# Patient Record
Sex: Male | Born: 1960 | Race: White | Hispanic: No | Smoking: Never smoker
Health system: Southern US, Community
[De-identification: ages and names within clinical notes are randomized; demographics above are authoritative.]

## PROBLEM LIST (undated history)

## (undated) DIAGNOSIS — E119 Type 2 diabetes mellitus without complications: Secondary | ICD-10-CM

## (undated) DIAGNOSIS — I10 Essential (primary) hypertension: Secondary | ICD-10-CM

## (undated) DIAGNOSIS — M1990 Unspecified osteoarthritis, unspecified site: Secondary | ICD-10-CM

## (undated) DIAGNOSIS — R51 Headache: Secondary | ICD-10-CM

## (undated) HISTORY — PX: HERNIA REPAIR: SHX51

---

## 2014-04-08 ENCOUNTER — Other Ambulatory Visit (HOSPITAL_COMMUNITY): Payer: Self-pay | Admitting: Orthopaedic Surgery

## 2014-04-11 ENCOUNTER — Encounter (HOSPITAL_COMMUNITY): Payer: Self-pay | Admitting: Pharmacy Technician

## 2014-04-15 NOTE — Patient Instructions (Addendum)
20     Your procedure is scheduled on:  Friday 04/26/2014  Report to [facility] Main Entrance and follow signs to Short Stay  At 0745  AM.  Call this number if you have problems the night before or morning of surgery: [phone]   Remember:             IF YOU USE CPAP,BRING MASK AND TUBING AM OF SURGERY!             IF YOU DO NOT HAVE YOUR TYPE AND SCREEN DRAWN AT PRE-ADMIT APPOINTMENT, YOU WILL HAVE IT DRAWN AM OF SURGERY!   Do not eat food or drink liquids AFTER MIDNIGHT!  Take these medicines the morning of surgery with A SIP OF WATER: NONE    [facility] IS NOT RESPONSIBLE FOR ANY BELONGINGS OR VALUABLES BROUGHT TO HOSPITAL.  Marland Kitchen  Leave suitcase in the car. After surgery it may be brought to your room.  For patients admitted to the hospital, checkout time is 11:00 AM the day of              Discharge.    DO NOT WEAR JEWELRY,MAKE-UP,LOTIONS,POWDERS,PERFUMES,CONTACTS , DENTURES OR BRIDGEWORK ,AND DO NOT WEAR FALSE EYELASHES                                    Patients discharged the day of surgery will not be allowed to drive home. If going home the same day of surgery, must have someone stay with you first 24 hrs.at home and arrange for someone to drive you home from the Hospital.                         YOUR DRIVER IS: N/A   Special Instructions:              Please read over the following fact sheets that you were given:             1. [place] PREPARING FOR SURGERY SHEET              2.INCENTIVE SPIROMETRY                                        [place].Cathlean Sauer     [phone]                              [facility] - Preparing for Surgery Before surgery, you can play an important role.  Because skin is not sterile, your skin needs to be as free of germs as possible.  You can reduce the number of germs on your skin by washing with CHG (chlorahexidine gluconate) soap before surgery.  CHG is an antiseptic cleaner which kills germs and bonds with the skin to  continue killing germs even after washing. Please DO NOT use if you have an allergy to CHG or antibacterial soaps.  If your skin becomes reddened/irritated stop using the CHG and inform your nurse when you arrive at Short Stay. Do not shave (including legs and underarms) for at least 48 hours prior to the first CHG shower.  You may shave your face/neck. Please follow these instructions carefully:  1.  Shower with CHG Soap the night before surgery and  the  morning of Surgery.  2.  If you choose to wash your hair, wash your hair first as usual with your  normal  shampoo.  3.  After you shampoo, rinse your hair and body thoroughly to remove the  shampoo.                           4.  Use CHG as you would any other liquid soap.  You can apply chg directly  to the skin and wash                       Gently with a scrungie or clean washcloth.  5.  Apply the CHG Soap to your body ONLY FROM THE NECK DOWN.   Do not use on face/ open                           Wound or open sores. Avoid contact with eyes, ears mouth and genitals (private parts).                       Wash face,  Genitals (private parts) with your normal soap.             6.  Wash thoroughly, paying special attention to the area where your surgery  will be performed.  7.  Thoroughly rinse your body with warm water from the neck down.  8.  DO NOT shower/wash with your normal soap after using and rinsing off  the CHG Soap.                9.  Pat yourself dry with a clean towel.            10.  Wear clean pajamas.            11.  Place clean sheets on your bed the night of your first shower and do not  sleep with pets. Day of Surgery : Do not apply any lotions/deodorants the morning of surgery.  Please wear clean clothes to the hospital/surgery center.  FAILURE TO FOLLOW THESE INSTRUCTIONS MAY RESULT IN THE CANCELLATION OF YOUR SURGERY PATIENT SIGNATURE_________________________________  NURSE  SIGNATURE__________________________________  ________________________________________________________________________   Rogelia MireIncentive Spirometer  An incentive spirometer is a tool that can help keep your lungs clear and active. This tool measures how well you are filling your lungs with each breath. Taking long deep breaths may help reverse or decrease the chance of developing breathing (pulmonary) problems (especially infection) following:  A long period of time when you are unable to move or be active. BEFORE THE PROCEDURE   If the spirometer includes an indicator to show your best effort, your nurse or respiratory therapist will set it to a desired goal.  If possible, sit up straight or lean slightly forward. Try not to slouch.  Hold the incentive spirometer in an upright position. INSTRUCTIONS FOR USE  1. Sit on the edge of your bed if possible, or sit up as far as you can in bed or on a chair. 2. Hold the incentive spirometer in an upright position. 3. Breathe out normally. 4. Place the mouthpiece in your mouth and seal your lips tightly around it. 5. Breathe in slowly and as deeply as possible, raising the piston or the ball toward the top of the column. 6. Hold your breath for 3-5 seconds or for as long  as possible. Allow the piston or ball to fall to the bottom of the column. 7. Remove the mouthpiece from your mouth and breathe out normally. 8. Rest for a few seconds and repeat Steps 1 through 7 at least 10 times every 1-2 hours when you are awake. Take your time and take a few normal breaths between deep breaths. 9. The spirometer may include an indicator to show your best effort. Use the indicator as a goal to work toward during each repetition. 10. After each set of 10 deep breaths, practice coughing to be sure your lungs are clear. If you have an incision (the cut made at the time of surgery), support your incision when coughing by placing a pillow or rolled up towels firmly  against it. Once you are able to get out of bed, walk around indoors and cough well. You may stop using the incentive spirometer when instructed by your caregiver.  RISKS AND COMPLICATIONS  Take your time so you do not get dizzy or light-headed.  If you are in pain, you may need to take or ask for pain medication before doing incentive spirometry. It is harder to take a deep breath if you are having pain. AFTER USE  Rest and breathe slowly and easily.  It can be helpful to keep track of a log of your progress. Your caregiver can provide you with a simple table to help with this. If you are using the spirometer at home, follow these instructions: SEEK MEDICAL CARE IF:   You are having difficultly using the spirometer.  You have trouble using the spirometer as often as instructed.  Your pain medication is not giving enough relief while using the spirometer.  You develop fever of 100.5 F (38.1 C) or higher. SEEK IMMEDIATE MEDICAL CARE IF:   You cough up bloody sputum that had not been present before.  You develop fever of 102 F (38.9 C) or greater.  You develop worsening pain at or near the incision site. MAKE SURE YOU:   Understand these instructions.  Will watch your condition.  Will get help right away if you are not doing well or get worse. Document Released: 02/07/2007 Document Revised: 12/20/2011 Document Reviewed: 04/10/2007 ExitCare Patient Information 2014 [employer], [place].   ________________________________________________________________________  WHAT IS A BLOOD TRANSFUSION? Blood Transfusion Information  A transfusion is the replacement of blood or some of its parts. Blood is made up of multiple cells which provide different functions.  Red blood cells carry oxygen and are used for blood loss replacement.  White blood cells fight against infection.  Platelets control bleeding.  Plasma helps clot blood.  Other blood products are available for  specialized needs, such as hemophilia or other clotting disorders. BEFORE THE TRANSFUSION  Who gives blood for transfusions?   Healthy volunteers who are fully evaluated to make sure their blood is safe. This is blood bank blood. Transfusion therapy is the safest it has ever been in the practice of medicine. Before blood is taken from a donor, a complete history is taken to make sure that person has no history of diseases nor engages in risky social behavior (examples are intravenous drug use or sexual activity with multiple partners). The donor's travel history is screened to minimize risk of transmitting infections, such as malaria. The donated blood is tested for signs of infectious diseases, such as HIV and hepatitis. The blood is then tested to be sure it is compatible with you in order to minimize the chance of  a transfusion reaction. If you or a relative donates blood, this is often done in anticipation of surgery and is not appropriate for emergency situations. It takes many days to process the donated blood. RISKS AND COMPLICATIONS Although transfusion therapy is very safe and saves many lives, the main dangers of transfusion include:   Getting an infectious disease.  Developing a transfusion reaction. This is an allergic reaction to something in the blood you were given. Every precaution is taken to prevent this. The decision to have a blood transfusion has been considered carefully by your caregiver before blood is given. Blood is not given unless the benefits outweigh the risks. AFTER THE TRANSFUSION  Right after receiving a blood transfusion, you will usually feel much better and more energetic. This is especially true if your red blood cells have gotten low (anemic). The transfusion raises the level of the red blood cells which carry oxygen, and this usually causes an energy increase.  The nurse administering the transfusion will monitor you carefully for complications. HOME CARE  INSTRUCTIONS  No special instructions are needed after a transfusion. You may find your energy is better. Speak with your caregiver about any limitations on activity for underlying diseases you may have. SEEK MEDICAL CARE IF:   Your condition is not improving after your transfusion.  You develop redness or irritation at the intravenous (IV) site. SEEK IMMEDIATE MEDICAL CARE IF:  Any of the following symptoms occur over the next 12 hours:  Shaking chills.  You have a temperature by mouth above 102 F (38.9 C), not controlled by medicine.  Chest, back, or muscle pain.  People around you feel you are not acting correctly or are confused.  Shortness of breath or difficulty breathing.  Dizziness and fainting.  You get a rash or develop hives.  You have a decrease in urine output.  Your urine turns a dark color or changes to pink, red, or brown. Any of the following symptoms occur over the next 10 days:  You have a temperature by mouth above 102 F (38.9 C), not controlled by medicine.  Shortness of breath.  Weakness after normal activity.  The white part of the eye turns yellow (jaundice).  You have a decrease in the amount of urine or are urinating less often.  Your urine turns a dark color or changes to pink, red, or brown. Document Released: 09/24/2000 Document Revised: 12/20/2011 Document Reviewed: 05/13/2008 [facility] Patient Information 2014 [place], [place].  _______________________________________________________________________

## 2014-04-16 ENCOUNTER — Encounter (HOSPITAL_COMMUNITY): Payer: Self-pay

## 2014-04-16 ENCOUNTER — Encounter (INDEPENDENT_AMBULATORY_CARE_PROVIDER_SITE_OTHER): Payer: Self-pay

## 2014-04-16 ENCOUNTER — Encounter (HOSPITAL_COMMUNITY)
Admission: RE | Admit: 2014-04-16 | Discharge: 2014-04-16 | Disposition: A | Discharge status: Home / Self Care | Payer: BC Managed Care – PPO | Source: Ambulatory Visit | Attending: Orthopaedic Surgery | Admitting: Orthopaedic Surgery

## 2014-04-16 DIAGNOSIS — Z01812 Encounter for preprocedural laboratory examination: Secondary | ICD-10-CM | POA: Insufficient documentation

## 2014-04-16 DIAGNOSIS — Z01818 Encounter for other preprocedural examination: Secondary | ICD-10-CM | POA: Insufficient documentation

## 2014-04-16 HISTORY — DX: Essential (primary) hypertension: I10

## 2014-04-16 HISTORY — DX: Unspecified osteoarthritis, unspecified site: M19.90

## 2014-04-16 HISTORY — DX: Headache: R51

## 2014-04-16 HISTORY — DX: Type 2 diabetes mellitus without complications: E11.9

## 2014-04-16 LAB — URINALYSIS, ROUTINE W REFLEX MICROSCOPIC
Bilirubin Urine: NEGATIVE
Glucose, UA: NEGATIVE mg/dL
Hgb urine dipstick: NEGATIVE
KETONES UR: NEGATIVE mg/dL
Leukocytes, UA: NEGATIVE
NITRITE: NEGATIVE
PROTEIN: NEGATIVE mg/dL
Specific Gravity, Urine: 1.02 (ref 1.005–1.030)
Urobilinogen, UA: 1 mg/dL (ref 0.0–1.0)
pH: 6 (ref 5.0–8.0)

## 2014-04-16 LAB — SURGICAL PCR SCREEN
MRSA, PCR: NEGATIVE
Staphylococcus aureus: NEGATIVE

## 2014-04-16 LAB — CBC
HCT: 43.1 % (ref 39.0–52.0)
Hemoglobin: 14.8 g/dL (ref 13.0–17.0)
MCH: 31.8 pg (ref 26.0–34.0)
MCHC: 34.3 g/dL (ref 30.0–36.0)
MCV: 92.7 fL (ref 78.0–100.0)
PLATELETS: 225 10*3/uL (ref 150–400)
RBC: 4.65 MIL/uL (ref 4.22–5.81)
RDW: 12.7 % (ref 11.5–15.5)
WBC: 9.5 10*3/uL (ref 4.0–10.5)

## 2014-04-16 LAB — BASIC METABOLIC PANEL
Anion gap: 10 (ref 5–15)
BUN: 13 mg/dL (ref 6–23)
CALCIUM: 10 mg/dL (ref 8.4–10.5)
CO2: 29 mEq/L (ref 19–32)
CREATININE: 0.76 mg/dL (ref 0.50–1.35)
Chloride: 99 mEq/L (ref 96–112)
GLUCOSE: 136 mg/dL — AB (ref 70–99)
Potassium: 4.6 mEq/L (ref 3.7–5.3)
Sodium: 138 mEq/L (ref 137–147)

## 2014-04-16 NOTE — Progress Notes (Signed)
04/16/14 1431  OBSTRUCTIVE SLEEP APNEA  Have you ever been diagnosed with sleep apnea through a sleep study? No  Do you snore loudly (loud enough to be heard through closed doors)?  0  Do you often feel tired, fatigued, or sleepy during the daytime? 0  Has anyone observed you stop breathing during your sleep? 0  Do you have, or are you being treated for high blood pressure? 1  BMI more than 35 kg/m2? 1  Age over 53 years old? 1  Neck circumference greater than 40 cm/16 inches? 1  Gender: 1  Obstructive Sleep Apnea Score 5  Score 4 or greater  Results sent to PCP

## 2014-04-16 NOTE — Progress Notes (Signed)
03/19/2014-EKG and last office visit from Dr. Leodis BinetJaswani on chart.

## 2014-04-26 ENCOUNTER — Inpatient Hospital Stay (HOSPITAL_COMMUNITY): Payer: BC Managed Care – PPO

## 2014-04-26 ENCOUNTER — Inpatient Hospital Stay (HOSPITAL_COMMUNITY): Payer: BC Managed Care – PPO | Admitting: Anesthesiology

## 2014-04-26 ENCOUNTER — Encounter (HOSPITAL_COMMUNITY): Payer: Self-pay | Admitting: Anesthesiology

## 2014-04-26 ENCOUNTER — Encounter (HOSPITAL_COMMUNITY): Payer: BC Managed Care – PPO | Admitting: Anesthesiology

## 2014-04-26 ENCOUNTER — Encounter (HOSPITAL_COMMUNITY)
Admission: RE | Disposition: A | Discharge status: Home Health | Payer: Self-pay | Source: Ambulatory Visit | Attending: Orthopaedic Surgery

## 2014-04-26 ENCOUNTER — Inpatient Hospital Stay (HOSPITAL_COMMUNITY)
Admission: RE | Admit: 2014-04-26 | Discharge: 2014-04-29 | DRG: 470 | Disposition: A | Discharge status: Home Health | Payer: BC Managed Care – PPO | Source: Ambulatory Visit | Attending: Orthopaedic Surgery | Admitting: Orthopaedic Surgery

## 2014-04-26 DIAGNOSIS — M1612 Unilateral primary osteoarthritis, left hip: Secondary | ICD-10-CM

## 2014-04-26 DIAGNOSIS — Z96649 Presence of unspecified artificial hip joint: Secondary | ICD-10-CM

## 2014-04-26 DIAGNOSIS — M25469 Effusion, unspecified knee: Secondary | ICD-10-CM | POA: Diagnosis present

## 2014-04-26 DIAGNOSIS — Z6838 Body mass index (BMI) 38.0-38.9, adult: Secondary | ICD-10-CM

## 2014-04-26 DIAGNOSIS — I10 Essential (primary) hypertension: Secondary | ICD-10-CM | POA: Diagnosis present

## 2014-04-26 DIAGNOSIS — E669 Obesity, unspecified: Secondary | ICD-10-CM | POA: Diagnosis present

## 2014-04-26 DIAGNOSIS — Z01812 Encounter for preprocedural laboratory examination: Secondary | ICD-10-CM

## 2014-04-26 DIAGNOSIS — E119 Type 2 diabetes mellitus without complications: Secondary | ICD-10-CM | POA: Diagnosis present

## 2014-04-26 DIAGNOSIS — M1610 Unilateral primary osteoarthritis, unspecified hip: Principal | ICD-10-CM | POA: Diagnosis present

## 2014-04-26 DIAGNOSIS — M169 Osteoarthritis of hip, unspecified: Principal | ICD-10-CM | POA: Diagnosis present

## 2014-04-26 HISTORY — PX: TOTAL HIP ARTHROPLASTY: SHX124

## 2014-04-26 LAB — TYPE AND SCREEN
ABO/RH(D): O POS
Antibody Screen: NEGATIVE

## 2014-04-26 LAB — GLUCOSE, CAPILLARY
GLUCOSE-CAPILLARY: 131 mg/dL — AB (ref 70–99)
GLUCOSE-CAPILLARY: 135 mg/dL — AB (ref 70–99)
Glucose-Capillary: 106 mg/dL — ABNORMAL HIGH (ref 70–99)
Glucose-Capillary: 142 mg/dL — ABNORMAL HIGH (ref 70–99)

## 2014-04-26 LAB — PROTIME-INR
INR: 1.01 (ref 0.00–1.49)
Prothrombin Time: 13.3 seconds (ref 11.6–15.2)

## 2014-04-26 LAB — APTT: aPTT: 31 seconds (ref 24–37)

## 2014-04-26 LAB — ABO/RH: ABO/RH(D): O POS

## 2014-04-26 SURGERY — ARTHROPLASTY, HIP, TOTAL, ANTERIOR APPROACH
Anesthesia: Spinal | Site: Hip | Laterality: Left

## 2014-04-26 MED ORDER — ACETAMINOPHEN 10 MG/ML IV SOLN
1000.0000 mg | Freq: Once | INTRAVENOUS | Status: AC
  Administered 2014-04-26: 1000 mg via INTRAVENOUS
  Filled 2014-04-26: qty 100

## 2014-04-26 MED ORDER — PROPOFOL 10 MG/ML IV BOLUS
INTRAVENOUS | Status: AC
  Filled 2014-04-26: qty 20

## 2014-04-26 MED ORDER — INSULIN ASPART 100 UNIT/ML ~~LOC~~ SOLN: 0.0000 [IU] | Freq: Three times a day (TID) | SUBCUTANEOUS | Status: DC

## 2014-04-26 MED ORDER — ALUM & MAG HYDROXIDE-SIMETH 200-200-20 MG/5ML PO SUSP
30.0000 mL | ORAL | Status: DC | PRN
  Administered 2014-04-29: 30 mL via ORAL
  Filled 2014-04-26: qty 30

## 2014-04-26 MED ORDER — BUPIVACAINE HCL (PF) 0.5 % IJ SOLN
INTRAMUSCULAR | Status: DC | PRN
  Administered 2014-04-26: 3 mL

## 2014-04-26 MED ORDER — ONDANSETRON HCL 4 MG/2ML IJ SOLN: 4.0000 mg | Freq: Four times a day (QID) | INTRAMUSCULAR | Status: DC | PRN

## 2014-04-26 MED ORDER — PHENOL 1.4 % MT LIQD
1.0000 | OROMUCOSAL | Status: DC | PRN
  Filled 2014-04-26: qty 177

## 2014-04-26 MED ORDER — SUMATRIPTAN SUCCINATE 100 MG PO TABS
100.0000 mg | ORAL_TABLET | ORAL | Status: DC | PRN
Start: 2014-04-26 — End: 2014-04-29
  Filled 2014-04-26: qty 1

## 2014-04-26 MED ORDER — ACETAMINOPHEN 325 MG PO TABS
650.0000 mg | ORAL_TABLET | Freq: Four times a day (QID) | ORAL | Status: DC | PRN
  Administered 2014-04-26 – 2014-04-28 (×3): 650 mg via ORAL
  Filled 2014-04-26 (×3): qty 2

## 2014-04-26 MED ORDER — PROPOFOL 10 MG/ML IV BOLUS
INTRAVENOUS | Status: DC | PRN
  Administered 2014-04-26: 40 mg via INTRAVENOUS

## 2014-04-26 MED ORDER — FENTANYL CITRATE 0.05 MG/ML IJ SOLN
INTRAMUSCULAR | Status: AC
  Filled 2014-04-26: qty 2

## 2014-04-26 MED ORDER — PHENYLEPHRINE HCL 10 MG/ML IJ SOLN: 30.0000 ug/min | INTRAVENOUS | Status: DC

## 2014-04-26 MED ORDER — OXYCODONE HCL 5 MG/5ML PO SOLN: 5.0000 mg | Freq: Once | ORAL | Status: DC | PRN

## 2014-04-26 MED ORDER — OXYCODONE HCL 5 MG PO TABS
5.0000 mg | ORAL_TABLET | ORAL | Status: DC | PRN
  Administered 2014-04-26: 10 mg via ORAL
  Administered 2014-04-26: 15 mg via ORAL
  Administered 2014-04-26: 5 mg via ORAL
  Administered 2014-04-27: 15 mg via ORAL
  Administered 2014-04-27: 10 mg via ORAL
  Administered 2014-04-27 (×3): 15 mg via ORAL
  Administered 2014-04-27: 10 mg via ORAL
  Administered 2014-04-27 – 2014-04-28 (×2): 15 mg via ORAL
  Administered 2014-04-28: 10 mg via ORAL
  Administered 2014-04-28: 15 mg via ORAL
  Administered 2014-04-28: 10 mg via ORAL
  Administered 2014-04-28: 15 mg via ORAL
  Administered 2014-04-28: 10 mg via ORAL
  Administered 2014-04-29: 5 mg via ORAL
  Administered 2014-04-29: 10 mg via ORAL
  Filled 2014-04-26: qty 2
  Filled 2014-04-26: qty 1
  Filled 2014-04-26 (×6): qty 3
  Filled 2014-04-26 (×2): qty 2
  Filled 2014-04-26: qty 1
  Filled 2014-04-26: qty 2
  Filled 2014-04-26: qty 1
  Filled 2014-04-26: qty 3
  Filled 2014-04-26: qty 2
  Filled 2014-04-26: qty 3
  Filled 2014-04-26: qty 1
  Filled 2014-04-26 (×2): qty 3
  Filled 2014-04-26: qty 2

## 2014-04-26 MED ORDER — ONDANSETRON HCL 4 MG PO TABS
4.0000 mg | ORAL_TABLET | Freq: Four times a day (QID) | ORAL | Status: DC | PRN
  Administered 2014-04-27 – 2014-04-29 (×4): 4 mg via ORAL
  Filled 2014-04-26 (×4): qty 1

## 2014-04-26 MED ORDER — POLYETHYLENE GLYCOL 3350 17 G PO PACK
17.0000 g | PACK | Freq: Every day | ORAL | Status: DC | PRN
  Administered 2014-04-27 – 2014-04-28 (×2): 17 g via ORAL

## 2014-04-26 MED ORDER — GLYCOPYRROLATE 0.2 MG/ML IJ SOLN
INTRAMUSCULAR | Status: DC | PRN
  Administered 2014-04-26: 0.2 mg via INTRAVENOUS

## 2014-04-26 MED ORDER — HYDROMORPHONE HCL PF 1 MG/ML IJ SOLN
0.2500 mg | INTRAMUSCULAR | Status: DC | PRN
  Administered 2014-04-26 (×4): 0.5 mg via INTRAVENOUS

## 2014-04-26 MED ORDER — ACETAMINOPHEN 650 MG RE SUPP: 650.0000 mg | Freq: Four times a day (QID) | RECTAL | Status: DC | PRN

## 2014-04-26 MED ORDER — TRANEXAMIC ACID 100 MG/ML IV SOLN
1000.0000 mg | INTRAVENOUS | Status: AC
  Administered 2014-04-26: 1000 mg via INTRAVENOUS
  Filled 2014-04-26: qty 10

## 2014-04-26 MED ORDER — LACTATED RINGERS IV SOLN
INTRAVENOUS | Status: DC
  Administered 2014-04-26: 1000 mL via INTRAVENOUS
  Administered 2014-04-26 (×2): via INTRAVENOUS

## 2014-04-26 MED ORDER — MENTHOL 3 MG MT LOZG
1.0000 | LOZENGE | OROMUCOSAL | Status: DC | PRN
  Filled 2014-04-26: qty 9

## 2014-04-26 MED ORDER — SODIUM CHLORIDE 0.9 % IV SOLN
INTRAVENOUS | Status: DC | PRN
  Administered 2014-04-26: 1000 mL

## 2014-04-26 MED ORDER — KETOROLAC TROMETHAMINE 30 MG/ML IJ SOLN
30.0000 mg | Freq: Once | INTRAMUSCULAR | Status: AC
  Administered 2014-04-26: 30 mg via INTRAVENOUS
  Filled 2014-04-26: qty 2
  Filled 2014-04-26: qty 1

## 2014-04-26 MED ORDER — PROPOFOL INFUSION 10 MG/ML OPTIME
INTRAVENOUS | Status: DC | PRN
  Administered 2014-04-26: 120 ug/kg/min via INTRAVENOUS

## 2014-04-26 MED ORDER — BUPIVACAINE HCL (PF) 0.5 % IJ SOLN
INTRAMUSCULAR | Status: AC
  Filled 2014-04-26: qty 30

## 2014-04-26 MED ORDER — FENTANYL CITRATE 0.05 MG/ML IJ SOLN
INTRAMUSCULAR | Status: DC | PRN
  Administered 2014-04-26 (×2): 50 ug via INTRAVENOUS

## 2014-04-26 MED ORDER — GLYCOPYRROLATE 0.2 MG/ML IJ SOLN
INTRAMUSCULAR | Status: AC
  Filled 2014-04-26: qty 1

## 2014-04-26 MED ORDER — DOCUSATE SODIUM 100 MG PO CAPS
100.0000 mg | ORAL_CAPSULE | Freq: Two times a day (BID) | ORAL | Status: DC
  Administered 2014-04-26 – 2014-04-29 (×6): 100 mg via ORAL

## 2014-04-26 MED ORDER — PHENYLEPHRINE HCL 10 MG/ML IJ SOLN
10.0000 mg | INTRAVENOUS | Status: DC | PRN
  Administered 2014-04-26: 40 ug/min via INTRAVENOUS

## 2014-04-26 MED ORDER — METOCLOPRAMIDE HCL 10 MG PO TABS
5.0000 mg | ORAL_TABLET | Freq: Three times a day (TID) | ORAL | Status: DC | PRN
  Administered 2014-04-27 – 2014-04-28 (×2): 10 mg via ORAL
  Filled 2014-04-26 (×2): qty 1

## 2014-04-26 MED ORDER — OXYCODONE HCL 5 MG PO TABS: 5.0000 mg | ORAL_TABLET | Freq: Once | ORAL | Status: DC | PRN

## 2014-04-26 MED ORDER — DEXTROSE 5 % IV SOLN
3.0000 g | INTRAVENOUS | Status: AC
  Administered 2014-04-26: 3 g via INTRAVENOUS
  Filled 2014-04-26: qty 3000

## 2014-04-26 MED ORDER — FERROUS SULFATE 325 (65 FE) MG PO TABS
325.0000 mg | ORAL_TABLET | Freq: Three times a day (TID) | ORAL | Status: DC
  Administered 2014-04-26 – 2014-04-28 (×7): 325 mg via ORAL
  Filled 2014-04-26 (×11): qty 1

## 2014-04-26 MED ORDER — SODIUM CHLORIDE 0.9 % IR SOLN
Status: DC | PRN
  Administered 2014-04-26: 1000 mL

## 2014-04-26 MED ORDER — HYDROMORPHONE HCL PF 1 MG/ML IJ SOLN
INTRAMUSCULAR | Status: AC
  Filled 2014-04-26: qty 1

## 2014-04-26 MED ORDER — HYDROMORPHONE HCL PF 1 MG/ML IJ SOLN
1.0000 mg | INTRAMUSCULAR | Status: DC | PRN
  Administered 2014-04-26 (×3): 1 mg via INTRAVENOUS
  Filled 2014-04-26 (×3): qty 1

## 2014-04-26 MED ORDER — PHENYLEPHRINE HCL 10 MG/ML IJ SOLN
INTRAMUSCULAR | Status: AC
  Filled 2014-04-26: qty 1

## 2014-04-26 MED ORDER — MIDAZOLAM HCL 5 MG/5ML IJ SOLN
INTRAMUSCULAR | Status: DC | PRN
  Administered 2014-04-26: 2 mg via INTRAVENOUS

## 2014-04-26 MED ORDER — MEPERIDINE HCL 50 MG/ML IJ SOLN: 6.2500 mg | INTRAMUSCULAR | Status: DC | PRN

## 2014-04-26 MED ORDER — ZOLPIDEM TARTRATE 5 MG PO TABS: 5.0000 mg | ORAL_TABLET | Freq: Every evening | ORAL | Status: DC | PRN

## 2014-04-26 MED ORDER — SIMVASTATIN 10 MG PO TABS
10.0000 mg | ORAL_TABLET | Freq: Every day | ORAL | Status: DC
  Administered 2014-04-26 – 2014-04-29 (×4): 10 mg via ORAL
  Filled 2014-04-26 (×4): qty 1

## 2014-04-26 MED ORDER — METHOCARBAMOL 1000 MG/10ML IJ SOLN
500.0000 mg | Freq: Four times a day (QID) | INTRAVENOUS | Status: DC | PRN
  Administered 2014-04-26 (×2): 500 mg via INTRAVENOUS
  Filled 2014-04-26 (×2): qty 5

## 2014-04-26 MED ORDER — MIDAZOLAM HCL 2 MG/2ML IJ SOLN
INTRAMUSCULAR | Status: AC
  Filled 2014-04-26: qty 2

## 2014-04-26 MED ORDER — PROMETHAZINE HCL 25 MG/ML IJ SOLN: 6.2500 mg | INTRAMUSCULAR | Status: DC | PRN

## 2014-04-26 MED ORDER — METOCLOPRAMIDE HCL 5 MG/ML IJ SOLN
5.0000 mg | Freq: Three times a day (TID) | INTRAMUSCULAR | Status: DC | PRN
  Administered 2014-04-26: 10 mg via INTRAVENOUS
  Filled 2014-04-26: qty 2

## 2014-04-26 MED ORDER — SODIUM CHLORIDE 0.9 % IV SOLN
INTRAVENOUS | Status: DC
  Administered 2014-04-26: 1000 mL via INTRAVENOUS
  Administered 2014-04-27: 06:00:00 via INTRAVENOUS

## 2014-04-26 MED ORDER — CEFAZOLIN SODIUM-DEXTROSE 2-3 GM-% IV SOLR
2.0000 g | Freq: Four times a day (QID) | INTRAVENOUS | Status: AC
  Administered 2014-04-26 (×2): 2 g via INTRAVENOUS
  Filled 2014-04-26 (×2): qty 50

## 2014-04-26 MED ORDER — METFORMIN HCL ER 750 MG PO TB24
750.0000 mg | ORAL_TABLET | Freq: Two times a day (BID) | ORAL | Status: DC
  Administered 2014-04-26 – 2014-04-29 (×6): 750 mg via ORAL
  Filled 2014-04-26 (×8): qty 1

## 2014-04-26 MED ORDER — METHOCARBAMOL 500 MG PO TABS
500.0000 mg | ORAL_TABLET | Freq: Four times a day (QID) | ORAL | Status: DC | PRN
  Administered 2014-04-27 – 2014-04-29 (×4): 500 mg via ORAL
  Filled 2014-04-26 (×4): qty 1

## 2014-04-26 MED ORDER — DIPHENHYDRAMINE HCL 12.5 MG/5ML PO ELIX: 12.5000 mg | ORAL_SOLUTION | ORAL | Status: DC | PRN

## 2014-04-26 MED ORDER — OXYCODONE HCL ER 20 MG PO T12A
20.0000 mg | EXTENDED_RELEASE_TABLET | Freq: Two times a day (BID) | ORAL | Status: DC
  Administered 2014-04-26 – 2014-04-29 (×6): 20 mg via ORAL
  Filled 2014-04-26 (×6): qty 1

## 2014-04-26 MED ORDER — ASPIRIN EC 325 MG PO TBEC
325.0000 mg | DELAYED_RELEASE_TABLET | Freq: Two times a day (BID) | ORAL | Status: DC
  Administered 2014-04-26 – 2014-04-29 (×6): 325 mg via ORAL
  Filled 2014-04-26 (×8): qty 1

## 2014-04-26 SURGICAL SUPPLY — 46 items
BAG ZIPLOCK 12X15 (MISCELLANEOUS) ×3 IMPLANT
BENZOIN TINCTURE PRP APPL 2/3 (GAUZE/BANDAGES/DRESSINGS) IMPLANT
BLADE SAW SGTL 18X1.27X75 (BLADE) ×2 IMPLANT
BLADE SAW SGTL 18X1.27X75MM (BLADE) ×1
CAPT HIP PF COP ×3 IMPLANT
CELLS DAT CNTRL 66122 CELL SVR (MISCELLANEOUS) ×1 IMPLANT
CLOSURE WOUND 1/2 X4 (GAUZE/BANDAGES/DRESSINGS)
COVER PERINEAL POST (MISCELLANEOUS) ×3 IMPLANT
DRAPE C-ARM 42X120 X-RAY (DRAPES) ×3 IMPLANT
DRAPE STERI IOBAN 125X83 (DRAPES) ×3 IMPLANT
DRAPE U-SHAPE 47X51 STRL (DRAPES) ×9 IMPLANT
DRSG AQUACEL AG ADV 3.5X10 (GAUZE/BANDAGES/DRESSINGS) ×3 IMPLANT
DURAPREP 26ML APPLICATOR (WOUND CARE) ×3 IMPLANT
ELECT BLADE TIP CTD 4 INCH (ELECTRODE) ×3 IMPLANT
ELECT REM PT RETURN 9FT ADLT (ELECTROSURGICAL) ×3
ELECTRODE REM PT RTRN 9FT ADLT (ELECTROSURGICAL) ×1 IMPLANT
FACESHIELD WRAPAROUND (MASK) ×12 IMPLANT
GAUZE XEROFORM 1X8 LF (GAUZE/BANDAGES/DRESSINGS) ×3 IMPLANT
GLOVE BIO SURGEON STRL SZ7 (GLOVE) ×3 IMPLANT
GLOVE BIO SURGEON STRL SZ7.5 (GLOVE) ×3 IMPLANT
GLOVE BIOGEL PI IND STRL 6.5 (GLOVE) ×1 IMPLANT
GLOVE BIOGEL PI IND STRL 7.5 (GLOVE) ×1 IMPLANT
GLOVE BIOGEL PI IND STRL 8 (GLOVE) ×4 IMPLANT
GLOVE BIOGEL PI INDICATOR 6.5 (GLOVE) ×2
GLOVE BIOGEL PI INDICATOR 7.5 (GLOVE) ×2
GLOVE BIOGEL PI INDICATOR 8 (GLOVE) ×8
GLOVE ECLIPSE 7.5 STRL STRAW (GLOVE) ×3 IMPLANT
GLOVE ECLIPSE 8.0 STRL XLNG CF (GLOVE) ×3 IMPLANT
GOWN STRL REUS W/TWL XL LVL3 (GOWN DISPOSABLE) ×15 IMPLANT
HANDPIECE INTERPULSE COAX TIP (DISPOSABLE) ×2
HEAD CERAMIC DELTA 36 PLUS 1.5 (Hips) ×3 IMPLANT
KIT BASIN OR (CUSTOM PROCEDURE TRAY) ×3 IMPLANT
PACK TOTAL JOINT (CUSTOM PROCEDURE TRAY) ×3 IMPLANT
RTRCTR WOUND ALEXIS 18CM MED (MISCELLANEOUS) ×3
SET HNDPC FAN SPRY TIP SCT (DISPOSABLE) ×1 IMPLANT
SPONGE LAP 18X18 X RAY DECT (DISPOSABLE) ×3 IMPLANT
STAPLER VISISTAT 35W (STAPLE) ×6 IMPLANT
STRIP CLOSURE SKIN 1/2X4 (GAUZE/BANDAGES/DRESSINGS) IMPLANT
SUT ETHIBOND NAB CT1 #1 30IN (SUTURE) ×3 IMPLANT
SUT MNCRL AB 4-0 PS2 18 (SUTURE) IMPLANT
SUT VIC AB 0 CT1 36 (SUTURE) ×3 IMPLANT
SUT VIC AB 1 CT1 36 (SUTURE) ×3 IMPLANT
SUT VIC AB 2-0 CT1 27 (SUTURE) ×4
SUT VIC AB 2-0 CT1 TAPERPNT 27 (SUTURE) ×2 IMPLANT
TOWEL OR 17X26 10 PK STRL BLUE (TOWEL DISPOSABLE) ×3 IMPLANT
TRAY FOLEY CATH 16FRSI W/METER (SET/KITS/TRAYS/PACK) ×3 IMPLANT

## 2014-04-26 NOTE — Anesthesia Preprocedure Evaluation (Signed)
Anesthesia Evaluation  Patient identified by MRN, date of birth, ID band Patient awake    Reviewed: Allergy & Precautions, H&P , NPO status , Patient's Chart, lab work & pertinent test results  Airway Mallampati: II TM Distance: >3 FB Neck ROM: Full    Dental   Pulmonary neg pulmonary ROS,  breath sounds clear to auscultation        Cardiovascular hypertension, Pt. on medications Rhythm:Regular Rate:Normal     Neuro/Psych  Headaches, negative psych ROS   GI/Hepatic negative GI ROS, Neg liver ROS,   Endo/Other  negative endocrine ROSdiabetes, Type 2, Oral Hypoglycemic Agents  Renal/GU negative Renal ROS     Musculoskeletal negative musculoskeletal ROS (+)   Abdominal (+) + obese,   Peds  Hematology negative hematology ROS (+)   Anesthesia Other Findings   Reproductive/Obstetrics negative OB ROS                           Anesthesia Physical Anesthesia Plan  ASA: II  Anesthesia Plan:    Post-op Pain Management:    Induction:   Airway Management Planned:   Additional Equipment:   Intra-op Plan:   Post-operative Plan:   Informed Consent: I have reviewed the patients History and Physical, chart, labs and discussed the procedure including the risks, benefits and alternatives for the proposed anesthesia with the patient or authorized representative who has indicated his/her understanding and acceptance.   Dental advisory given  Plan Discussed with: CRNA  Anesthesia Plan Comments:         Anesthesia Quick Evaluation

## 2014-04-26 NOTE — Brief Op Note (Signed)
04/26/2014  11:44 AM  PATIENT:  Isaiah Park  53 y.o. male  PRE-OPERATIVE DIAGNOSIS:  Osteoarthritis left hip  POST-OPERATIVE DIAGNOSIS:  Osteoarthritis left hip  PROCEDURE:  Procedure(s): LEFT TOTAL HIP ARTHROPLASTY ANTERIOR APPROACH (Left)  SURGEON:  Surgeon(s) and Role:    * Kathryne Hitchhristopher Y Bianka Liberati, MD - Primary  PHYSICIAN ASSISTANT: Rexene EdisonGil Clark, PA-C  ASSISTANTS: Audry Piliyler Mohr, PA student 2   ANESTHESIA:   spinal  EBL:  Total I/O In: 2000 [I.V.:2000] Out: 1000 [Blood:1000]  BLOOD ADMINISTERED:none  DRAINS: none   LOCAL MEDICATIONS USED:  NONE  SPECIMEN:  No Specimen  DISPOSITION OF SPECIMEN:  N/A  COUNTS:  YES  TOURNIQUET:  * No tourniquets in log *  DICTATION: .Other Dictation: Dictation Number [id number]  PLAN OF CARE: Admit to inpatient   PATIENT DISPOSITION:  PACU - hemodynamically stable.   Delay start of Pharmacological VTE agent (>24hrs) due to surgical blood loss or risk of bleeding: no

## 2014-04-26 NOTE — Transfer of Care (Signed)
Immediate Anesthesia Transfer of Care Note  Patient: Isaiah Park  Procedure(s) Performed: Procedure(s) (LRB): LEFT TOTAL HIP ARTHROPLASTY ANTERIOR APPROACH (Left)  Patient Location: PACU  Anesthesia Type: Spinal  Level of Consciousness: sedated, patient cooperative and responds to stimulation  Airway & Oxygen Therapy: Patient Spontanous Breathing and Patient connected to face mask oxgen  Post-op Assessment: Report given to PACU RN and Post -op Vital signs reviewed and stable  Post vital signs: Reviewed and stable t-12 level on exam, released to staff, denied pain on assessment.   Complications: No apparent anesthesia complications

## 2014-04-26 NOTE — Anesthesia Procedure Notes (Signed)
Spinal  Patient location during procedure: OR Start time: 04/26/2014 9:51 AM End time: 04/26/2014 9:56 AM Staffing CRNA/Resident: Anne Fu Performed by: resident/CRNA  Preanesthetic Checklist Completed: patient identified, site marked, surgical consent, pre-op evaluation, timeout performed, IV checked, risks and benefits discussed and monitors and equipment checked Spinal Block Patient position: sitting Prep: Betadine Patient monitoring: heart rate, continuous pulse ox and blood pressure Location: L2-3 Injection technique: single-shot Needle Needle type: Whit[place]  Needle gauge: 25 G Needle length: 9 cm Assessment Sensory level: T4 Additional Notes Expiration date of kit checked and confirmed. Patient tolerated procedure well, without complications. X 1 attempt with noted clear CSF return noted aspiration and easy administration.  Noted loss of motor and sensory effect to bilat lower ext.

## 2014-04-26 NOTE — Progress Notes (Signed)
PT Cancellation Note  Patient Details Name: Isaiah Park MRN: [id number] DOB: 1961/07/21   Cancelled Treatment:     POD 0 eval deferred 2* elevated pain level.  Will follow in am.   Sarajane Fambrough 04/26/2014, 5:07 PM

## 2014-04-26 NOTE — H&P (Signed)
TOTAL HIP ADMISSION H&P  Patient is admitted for left total hip arthroplasty.  Subjective:  Chief Complaint: left hip pain  HPI: Isaiah Park, 53 y.o. male, has a history of pain and functional disability in the left hip(s) due to arthritis and patient has failed non-surgical conservative treatments for greater than 12 weeks to include NSAID's and/or analgesics, corticosteriod injections, weight reduction as appropriate and activity modification.  Onset of symptoms was gradual starting 2 years ago with gradually worsening course since that time.The patient noted no past surgery on the left hip(s).  Patient currently rates pain in the left hip at 8 out of 10 with activity. Patient has night pain, worsening of pain with activity and weight bearing, pain that interfers with activities of daily living and pain with passive range of motion. Patient has evidence of subchondral sclerosis, periarticular osteophytes and joint space narrowing by imaging studies. This condition presents safety issues increasing the risk of falls.  There is no current active infection.  Patient Active Problem List   Diagnosis Date Noted  . Arthritis of left hip 04/26/2014   Past Medical History  Diagnosis Date  . Hypertension   . Diabetes mellitus without complication   . Headache(784.0)     migraines  . Arthritis     Past Surgical History  Procedure Laterality Date  . Hernia repair      left inguinal     No prescriptions prior to admission   Allergies  Allergen Reactions  . Neosporin [Neomycin-Bacitracin Zn-Polymyx]     Burned skin-caused scabbing    History  Substance Use Topics  . Smoking status: Never Smoker   . Smokeless tobacco: Not on file  . Alcohol Use: Yes     Comment: occassionally beer    No family history on file.   Review of Systems  Musculoskeletal: Positive for joint pain.  All other systems reviewed and are negative.   Objective:  Physical Exam  Constitutional: He is oriented  to person, place, and time. He appears well-developed and well-nourished.  HENT:  Head: Normocephalic and atraumatic.  Eyes: EOM are normal. Pupils are equal, round, and reactive to light.  Neck: Normal range of motion. Neck supple.  Cardiovascular: Normal rate and regular rhythm.   Respiratory: Effort normal and breath sounds normal.  GI: Soft. Bowel sounds are normal.  Musculoskeletal:       Left hip: He exhibits decreased range of motion, decreased strength and bony tenderness.  Neurological: He is alert and oriented to person, place, and time.  Skin: Skin is warm and dry.  Psychiatric: He has a normal mood and affect.    Vital signs in last 24 hours:    Labs:   There is no height or weight on file to calculate BMI.   Imaging Review Plain radiographs demonstrate severe degenerative joint disease of the left hip(s). The bone quality appears to be good for age and reported activity level.  Assessment/Plan:  End stage arthritis, left hip(s)  The patient history, physical examination, clinical judgement of the provider and imaging studies are consistent with end stage degenerative joint disease of the left hip(s) and total hip arthroplasty is deemed medically necessary. The treatment options including medical management, injection therapy, arthroscopy and arthroplasty were discussed at length. The risks and benefits of total hip arthroplasty were presented and reviewed. The risks due to aseptic loosening, infection, stiffness, dislocation/subluxation,  thromboembolic complications and other imponderables were discussed.  The patient acknowledged the explanation, agreed to proceed with  the plan and consent was signed. Patient is being admitted for inpatient treatment for surgery, pain control, PT, OT, prophylactic antibiotics, VTE prophylaxis, progressive ambulation and ADL's and discharge planning.The patient is planning to be discharged home with home health services

## 2014-04-26 NOTE — Anesthesia Postprocedure Evaluation (Signed)
Anesthesia Post Note  Patient: Isaiah Park  Procedure(s) Performed: Procedure(s) (LRB): LEFT TOTAL HIP ARTHROPLASTY ANTERIOR APPROACH (Left)  Anesthesia type: Spinal  Patient location: PACU  Post pain: Pain level controlled  Post assessment: Post-op Vital signs reviewed  Last Vitals: BP 104/64  Pulse 53  Temp(Src) 36.3 C (Oral)  Resp 12  Ht 6\' 4"  (1.93 m)  Wt 315 lb (142.883 kg)  BMI 38.36 kg/m2  SpO2 99%  Post vital signs: Reviewed  Level of consciousness: sedated  Complications: No apparent anesthesia complications

## 2014-04-27 LAB — BASIC METABOLIC PANEL
Anion gap: 10 (ref 5–15)
BUN: 11 mg/dL (ref 6–23)
CALCIUM: 8.6 mg/dL (ref 8.4–10.5)
CHLORIDE: 97 meq/L (ref 96–112)
CO2: 28 meq/L (ref 19–32)
CREATININE: 0.64 mg/dL (ref 0.50–1.35)
GFR calc non Af Amer: >90 mL/min (ref >90)
Glucose, Bld: 143 mg/dL — ABNORMAL HIGH (ref 70–99)
Potassium: 4 mEq/L (ref 3.7–5.3)
SODIUM: 135 meq/L — AB (ref 137–147)

## 2014-04-27 LAB — CBC
HCT: 33 % — ABNORMAL LOW (ref 39.0–52.0)
Hemoglobin: 11.1 g/dL — ABNORMAL LOW (ref 13.0–17.0)
MCH: 31.2 pg (ref 26.0–34.0)
MCHC: 33.6 g/dL (ref 30.0–36.0)
MCV: 92.7 fL (ref 78.0–100.0)
PLATELETS: 161 10*3/uL (ref 150–400)
RBC: 3.56 MIL/uL — AB (ref 4.22–5.81)
RDW: 12.3 % (ref 11.5–15.5)
WBC: 8.8 10*3/uL (ref 4.0–10.5)

## 2014-04-27 LAB — GLUCOSE, CAPILLARY
Glucose-Capillary: 127 mg/dL — ABNORMAL HIGH (ref 70–99)
Glucose-Capillary: 153 mg/dL — ABNORMAL HIGH (ref 70–99)
Glucose-Capillary: 166 mg/dL — ABNORMAL HIGH (ref 70–99)

## 2014-04-27 NOTE — Progress Notes (Signed)
Pt unable to void on own, finally willing for I&O cath. Obtained 750 cc dark amber urine. Pt tolerated well & reports much relief. Luciana Cammarata, [employer]

## 2014-04-27 NOTE — Evaluation (Signed)
Physical Therapy Evaluation Patient Details Name: Isaiah CandleKeith A Wroe MRN: [id number] DOB: [birth year] Today's Date: 04/27/2014   History of Present Illness  L THR - ant dir  Clinical Impression  Pt s/p L THR presents with decreased L LE strength/ROM and post op pain limiting functional mobility.  Pt should progress to d/c home with family assist and HHPT follow up    Follow Up Recommendations Home health PT    Equipment Recommendations  Rolling walker with 5" wheels    Recommendations for Other Services OT consult     Precautions / Restrictions Precautions Precautions: Fall Restrictions Weight Bearing Restrictions: No Other Position/Activity Restrictions: WBAT      Mobility  Bed Mobility Overal bed mobility: Needs Assistance;+2 for physical assistance Bed Mobility: Supine to Sit     Supine to sit: Mod assist;+2 for physical assistance     General bed mobility comments: cues for sequence and use of R LE to self assist; physical assist to manage L LE and to assist trunk to upright position   Transfers Overall transfer level: Needs assistance Equipment used: Rolling walker (2 wheeled) Transfers: Sit to/from Stand Sit to Stand: Min assist;Mod assist;+2 physical assistance;+2 safety/equipment;From elevated surface         General transfer comment: cues for transition position and use of UEs to self assist  Ambulation/Gait Ambulation/Gait assistance: Min assist;+2 physical assistance;+2 safety/equipment Ambulation Distance (Feet): 5 Feet (twice) Assistive device: Rolling walker (2 wheeled) Gait Pattern/deviations: Step-to pattern;Decreased step length - right;Decreased step length - left;Shuffle;Trunk flexed Gait velocity: decr   General Gait Details: cues for posture, sequence and position from [employer]  Stairs            Wheelchair Mobility    Modified Rankin (Stroke Patients Only)       Balance                                              Pertinent Vitals/Pain 6/10 L hip and knee pain; premed, ice packs provided    Home Living Family/patient expects to be discharged to:: Private residence Living Arrangements: Alone Available Help at Discharge: Family;Friend(s) Type of Home: House Home Access: Stairs to enter Entrance Stairs-Rails: Right Entrance Stairs-Number of Steps: 15 Home Layout: One level Home Equipment: Cane - single point      Prior Function Level of Independence: Independent               Hand Dominance   Dominant Hand: Right    Extremity/Trunk Assessment   Upper Extremity Assessment: Overall WFL for tasks assessed           Lower Extremity Assessment: LLE deficits/detail   LLE Deficits / Details: Hip strength 2/5 with AAROM at hip to 70 flex and 10 abd     Communication   Communication: No difficulties  Cognition Arousal/Alertness: Awake/alert Behavior During Therapy: WFL for tasks assessed/performed Overall Cognitive Status: Within Functional Limits for tasks assessed                      General Comments      Exercises Total Joint Exercises Ankle Circles/Pumps: AROM;Both;10 reps;Supine Quad Sets: AROM;Both;10 reps;Supine Heel Slides: AAROM;15 reps;Supine;Left Hip ABduction/ADduction: AAROM;Left;10 reps;Supine      Assessment/Plan    PT Assessment Patient needs continued PT services  PT Diagnosis Difficulty walking   PT Problem List Decreased strength;Decreased  range of motion;Decreased activity tolerance;Decreased mobility;Decreased knowledge of use of DME;Pain;Obesity  PT Treatment Interventions DME instruction;Gait training;Stair training;Functional mobility training;Therapeutic activities;Therapeutic exercise;Patient/family education   PT Goals (Current goals can be found in the Care Plan section) Acute Rehab PT Goals Patient Stated Goal: Home PT Goal Formulation: With patient Time For Goal Achievement: 05/04/14 Potential to Achieve Goals: Good     Frequency 7X/week   Barriers to discharge        Co-evaluation               End of Session Equipment Utilized During Treatment: Gait belt Activity Tolerance: Patient limited by pain Patient left: in chair;with call bell/phone within reach;with family/visitor present Nurse Communication: Mobility status         Time: 4098-1191 PT Time Calculation (min): 41 min   Charges:   PT Evaluation $Initial PT Evaluation Tier I: 1 Procedure PT Treatments $Gait Training: 8-22 mins $Therapeutic Exercise: 8-22 mins   PT G Codes:          Eran Windish 04/27/2014, 1:20 PM

## 2014-04-27 NOTE — Progress Notes (Signed)
OT Cancellation Note  Patient Details Name: Isaiah Park MRN: [name] DOB: 09/21/1961   Cancelled Treatment:    Reason Eval/Treat Not Completed: Other (comment).  Pt was lightheaded with PT. Will check back tomorrow.  Ezriel Boffa 04/27/2014, 11:30 AM Marica OtterMaryellen Velicia Dejager, OTR/L [phone] 04/27/2014

## 2014-04-27 NOTE — Progress Notes (Signed)
Subjective: 1 Day Post-Op Procedure(s) (LRB): LEFT TOTAL HIP ARTHROPLASTY ANTERIOR APPROACH (Left) Patient reports pain as moderate.    Objective: Vital signs in last 24 hours: Temp:  [97.4 F (36.3 C)-98.4 F (36.9 C)] 98.4 F (36.9 C) (07/18 0615) Pulse Rate:  [50-92] 84 (07/18 0615) Resp:  [10-20] 18 (07/18 0800) BP: (97-149)/(58-74) 149/74 mmHg (07/18 0615) SpO2:  [95 %-100 %] 95 % (07/18 0800)  Intake/Output from previous day: 07/17 0701 - 07/18 0700 In: 6542.5 [P.O.:360; I.V.:5877.5; IV Piggyback:305] Out: 2800 [Urine:1800; Blood:1000] Intake/Output this shift: Total I/O In: 240 [P.O.:240] Out: -    Recent Labs  04/27/14 0642  HGB 11.1*    Recent Labs  04/27/14 0642  WBC 8.8  RBC 3.56*  HCT 33.0*  PLT 161    Recent Labs  04/27/14 0642  NA 135*  K 4.0  CL 97  CO2 28  BUN 11  CREATININE 0.64  GLUCOSE 143*  CALCIUM 8.6    Recent Labs  04/26/14 0805  INR 1.01    Neurologically intact  Assessment/Plan: 1 Day Post-Op Procedure(s) (LRB): LEFT TOTAL HIP ARTHROPLASTY ANTERIOR APPROACH (Left) Up with therapy  YATES,MARK C 04/27/2014, 10:36 AM

## 2014-04-27 NOTE — Op Note (Signed)
NAMELANG, ZINGG NO.:  [id number]  MEDICAL RECORD NO.:  [id number]  LOCATION:  [facility]                         FACILITY:  [facility]  PHYSICIAN:  Vanita Panda. Magnus Ivan, M.D.DATE OF BIRTH:  1961/07/20  DATE OF PROCEDURE:  04/26/2014 DATE OF DISCHARGE:                              OPERATIVE REPORT   PREOPERATIVE DIAGNOSES:  Severe end-stage arthritis and degenerative joint disease, left hip.  POSTOPERATIVE DIAGNOSES:  Severe end-stage arthritis and degenerative joint disease, left hip.  PROCEDURE:  Left total hip arthroplasty through direct anterior approach.  IMPLANTS:  DePuy Sector Gription acetabular component size 58 with 1 screw and apex hole eliminator guide, size 36+ 4 neutral polyethylene liner, size 12 Corail femoral component with standard offset, size 36+ 1.5 ceramic hip ball.  SURGEON:  Vanita Panda. Magnus Ivan, M.D.  ASSISTANT: 1. Richardean Canal, PA-C. 2. Audry Pili, PA student-2.  ANESTHESIA:  Spinal.  ANTIBIOTICS:  3 g IV Ancef.  BLOOD LOSS:  1000 mL.  COMPLICATIONS:  None.  INDICATIONS:  Mr. Kiner is a 53 year old gentleman who is 6 feet 4 inches and 330 pounds.  He has developed severe arthritis and pain involving his left hip has gotten worse and debilitating for him.  It has affected his activities of daily living, his quality of life, and his mobility.  He is at the point with failure of injections and conservative treatment including anti-inflammatories, rest, ice and heat, and time that is 12-hour days of working good year becoming debilitating for him as well.  He wishes to proceed with a total hip arthroplasty through direct anterior approach given the failure of conservative treatment.  The risks and benefits of surgery were explained to him in detail and he does wish to proceed.  PROCEDURE DESCRIPTION:  After informed consent was obtained, appropriate left hip was marked.  He was brought to the operating room and  spinal anesthesia was obtained while he is on the stretcher.  A Foley catheter was placed and then traction boots were placed on both of his feet.  He was next placed supine on the Hana fracture table with perineal post in place and both legs in inline skeletal traction devices, but no traction applied.  His left operative hip was then prepped and draped with DuraPrep and sterile drapes.  A time-out was called to identify correct patient, correct left hip.  I then made an incision inferior and posterior to the anterior superior iliac spine and carried this obliquely down the leg.  I dissected down tensor fascia lata muscle and tensor fascia was then divided longitudinally, so we could proceed with a direct anterior approach to the hip.  We cauterized the lateral femoral circumflex vessels and then placed Cobra retractors around lateral and medial femoral neck.  We then opened up the hip capsule in a L-type format and found a large joint effusion.  We made a femoral neck cut just proximal to the lesser trochanter with an oscillating saw and completed this on osteotome.  I placed a corkscrew guide in the femoral head and removed a very large femoral head, which was devoid of cartilage.  He then had degenerative labral tears that were removed  with a knife.  We placed a bent Hohmann medially and a curved retractor laterally and then began reaming in 2-mm increments from a size 42 all the way up to a size 58 with all reamers under direct visualization and a last reamer also under direct fluoroscopy, so we could obtain our depth of reaming and inclination and anteversion.  Once we are pleased with this, we placed a real DePuy Sector Gription acetabular component size 58, an apex hole eliminator guide and a single screw.  We then also placed a real 36+ 4 neutral polyethylene liner for size 58 cup. Attention was then turned to the femur with the leg externally rotated to 100 degrees, extended,  and adducted.  We were able to place a Mueller retractor medially and Hohmann retractor behind the greater trochanter. I released the lateral joint capsule.  I then used a box cutting osteotome to enter the femoral canal and the rongeur lateralizing, reaming, and broaching from a size 8 broach from the Corail broaching system up to a size 12.  The size 12 was felt to be tight and stable, so we trialed a standard neck and a 36+ 1.5 hip ball, we brought the leg back up and over with traction and internal rotation, reducing the pelvis and it was stable throughout its arc of rotation with leg lengths measuring the same as they were preop, which was equal and equal offset. We then dislocated the hip and removed the trial components and then placed the real Corail femoral component size 12 with a real 36+ 1.5 ceramic hip ball, and again reducing the pelvis and it was stable.  We then copiously irrigated the soft tissue with normal saline solution using pulsatile lavage.  We closed the joint capsule with interrupted #1 Ethibond suture followed by running #1 Vicryl in the tensor fascia, 0 Vicryl in the deep tissue, 2-0 Vicryl in subcutaneous tissue, interrupted staples on the skin.  Xeroform and well-padded Aquacel dressing was applied.  He was taken off the Hana table to the recovery room in stable condition.  All final counts were correct and no complications noted.     Vanita Pandahristopher Y. Magnus IvanBlackman, M.D.     CYB/MEDQ  D:  04/26/2014  T:  04/26/2014  Job:  [id number]

## 2014-04-27 NOTE — Progress Notes (Signed)
Physical Therapy Treatment Patient Details Name: Isaiah CandleKeith A Park MRN: [id number] DOB: 1961-06-20 Today's Date: 04/27/2014    History of Present Illness L THR - ant dir    PT Comments    Pt progressing but ltd by ongoing L knee pain and nausea  Follow Up Recommendations  Home health PT     Equipment Recommendations  Rolling walker with 5" wheels    Recommendations for Other Services OT consult     Precautions / Restrictions Precautions Precautions: Fall Restrictions Weight Bearing Restrictions: No Other Position/Activity Restrictions: WBAT    Mobility  Bed Mobility                  Transfers Overall transfer level: Needs assistance Equipment used: Rolling walker (2 wheeled) Transfers: Sit to/from Stand Sit to Stand: Min assist;+2 safety/equipment         General transfer comment: cues for transition position and use of UEs to self assist  Ambulation/Gait Ambulation/Gait assistance: Min assist;Min guard (chair follow) Ambulation Distance (Feet): 24 Feet Assistive device: Rolling walker (2 wheeled) Gait Pattern/deviations: Step-to pattern;Decreased step length - right;Decreased step length - left;Shuffle;Antalgic;Trunk flexed Gait velocity: decr   General Gait Details: cues for posture, sequence and position from RW: ltd WB on L LE 2* knee pain; distance ltd by nausea   Stairs            Wheelchair Mobility    Modified Rankin (Stroke Patients Only)       Balance                                    Cognition Arousal/Alertness: Awake/alert Behavior During Therapy: WFL for tasks assessed/performed Overall Cognitive Status: Within Functional Limits for tasks assessed                      Exercises      General Comments        Pertinent Vitals/Pain     Home Living                      Prior Function            PT Goals (current goals can now be found in the care plan section) Acute Rehab PT  Goals Patient Stated Goal: Home PT Goal Formulation: With patient Time For Goal Achievement: 05/04/14 Potential to Achieve Goals: Good Progress towards PT goals: Progressing toward goals    Frequency  7X/week    PT Plan Current plan remains appropriate    Co-evaluation             End of Session   Activity Tolerance: Patient limited by pain;Other (comment) (nausea) Patient left: in chair;with call bell/phone within reach;with family/visitor present     Time: 6962-95281625-1638 PT Time Calculation (min): 13 min  Charges:  $Gait Training: 8-22 mins                    G Codes:      Lachrista Heslin 04/27/2014, 5:24 PM

## 2014-04-28 LAB — CBC
HEMATOCRIT: 32.9 % — AB (ref 39.0–52.0)
Hemoglobin: 11.3 g/dL — ABNORMAL LOW (ref 13.0–17.0)
MCH: 31 pg (ref 26.0–34.0)
MCHC: 34.3 g/dL (ref 30.0–36.0)
MCV: 90.4 fL (ref 78.0–100.0)
Platelets: 151 10*3/uL (ref 150–400)
RBC: 3.64 MIL/uL — AB (ref 4.22–5.81)
RDW: 12.5 % (ref 11.5–15.5)
WBC: 11.8 10*3/uL — AB (ref 4.0–10.5)

## 2014-04-28 LAB — GLUCOSE, CAPILLARY
GLUCOSE-CAPILLARY: 178 mg/dL — AB (ref 70–99)
Glucose-Capillary: 151 mg/dL — ABNORMAL HIGH (ref 70–99)
Glucose-Capillary: 167 mg/dL — ABNORMAL HIGH (ref 70–99)
Glucose-Capillary: 143 mg/dL — ABNORMAL HIGH (ref 70–99)

## 2014-04-28 NOTE — Evaluation (Signed)
Occupational Therapy Evaluation Patient Details Name: Isaiah Park MRN: [id number] DOB: 04/02/1961 Today's Date: 04/28/2014    History of Present Illness L THR - ant dir   Clinical Impression   This 53 year old man was admitted for the above surgery.  He is limited by knee pain (worse after surgery) with adls and mobility.  Pt will benefit from skilled OT to further assess 3:1 which was delivered and for safety with transfers and toileting as pt will be by himself approximately 6 hours a day.  Family/friends will help with adls as needed    Follow Up Recommendations  No OT follow up (likely)    Equipment Recommendations  3 in 1 bedside comode (standard was delivered; will further assess fit)    Recommendations for Other Services       Precautions / Restrictions Precautions Precautions: Fall Restrictions Weight Bearing Restrictions: No Other Position/Activity Restrictions: WBAT      Mobility Bed Mobility               General bed mobility comments: friend assisted bil Legs--HOB was raised  Transfers   Equipment used: Rolling walker (2 wheeled) Transfers: Sit to/from [employer] to Stand: Min assist Stand pivot transfers: Min assist       General transfer comment: cues for UE placement, but pt pushed up on walker from high bed    Balance                                            ADL Overall ADL's : Needs assistance/impaired     Grooming: Set up;Sitting   Upper Body Bathing: Set up;Sitting   Lower Body Bathing: Moderate assistance;Sit to/from stand   Upper Body Dressing : Set up;Sitting   Lower Body Dressing: Maximal assistance;Sit to/from stand   Toilet Transfer: Minimal assistance;Stand-pivot (from very high bed to recliner)   Toileting- Clothing Manipulation and Hygiene: Minimal assistance;Sit to/from stand (standing with urinal)         General ADL Comments: Pt has someone staying with him, and she  and he have a system for moving/standing with urinal, positioning, etc.  Pt wants bed really high to get up and he also uses HOB raised for OOB.  Explained that before he leaves, we need to have him get up from a flat bed and also stand from the height of his bed.  He used 2 hands to push up on walker, which is OK from extremely high bed but educated that he will need one hand on bed for safety with lower height. Pt had a standard 3:1 commode delivered and was told the weight capacity is 350 pounds.  Will further assess for fit:  Pt did not want to try today.  Explained uses, removing back bar if needed to fit over commode and set on highest setting.   Educated on tub bench;  pt believes he will just wash at sink initially.  Also educated on reacher for adls and if something drops.  Pt will be alone for about 6 hours a day, he will be in recliner and thinks he'll likely sleep as he is a night owl.  Knee pain is what is limiting him with adls and mobility at this time.     Vision  Perception     Praxis      Pertinent Vitals/Pain Knee 6/10 in bed and 9/10 with movement. Repositioned and ice applied.  Pt was premedicated     Hand Dominance Right   Extremity/Trunk Assessment Upper Extremity Assessment Upper Extremity Assessment: Overall WFL for tasks assessed           Communication Communication Communication: No difficulties   Cognition Arousal/Alertness: Awake/alert Behavior During Therapy: WFL for tasks assessed/performed Overall Cognitive Status: Within Functional Limits for tasks assessed                     General Comments       Exercises       Shoulder Instructions      Home Living Family/patient expects to be discharged to:: Private residence Living Arrangements: Alone Available Help at Discharge: Family;Friend(s) Type of Home: House Home Access: Stairs to enter [employer] CorporationEntrance Stairs-Number of Steps: 15 Entrance Stairs-Rails:  Right Home Layout: One level     Bathroom Shower/Tub: Chief Strategy OfficerTub/shower unit   Bathroom Toilet: Standard     Home Equipment: Cane - single point   Additional Comments: standard 3:1 delivered to room; need to make sure it fits him      Prior Functioning/Environment Level of Independence: Independent             OT Diagnosis: Generalized weakness   OT Problem List: Decreased strength;Decreased activity tolerance;Pain;Decreased knowledge of use of DME or AE   OT Treatment/Interventions: Self-care/ADL training;DME and/or AE instruction;Patient/family education    OT Goals(Current goals can be found in the care plan section) Acute Rehab OT Goals Patient Stated Goal: Home OT Goal Formulation: With patient Time For Goal Achievement: 05/05/14 Potential to Achieve Goals: Good ADL Goals Pt Will Transfer to Toilet: with supervision;bedside commode;ambulating Pt Will Perform Toileting - Clothing Manipulation and hygiene: with supervision;sit to/from stand Additional ADL Goal #1: pt will not need safety cues for sit to stand with UE placement  OT Frequency: Min 2X/week   Barriers to D/C:            Co-evaluation              End of Session    Activity Tolerance: Patient tolerated treatment well Patient left: in chair;with call bell/phone within reach;with family/visitor present   Time: 1610-96041023-1051 OT Time Calculation (min): 28 min .mesCharges:  OT General Charges $OT Visit: 1 Procedure OT Evaluation $Initial OT Evaluation Tier I: 1 Procedure OT Treatments $Self Care/Home Management : 8-22 mins G-Codes:    Harish Bram 04/28/2014, 11:10 AM Marica OtterMaryellen Gaylon Bentz, OTR/L [phone] 04/28/2014

## 2014-04-28 NOTE — Progress Notes (Signed)
CARE MANAGEMENT NOTE 04/28/2014  Patient:  Isaiah Park,Isaiah Park   Account Number:  [id number]  Date Initiated:  04/27/2014  Documentation initiated by:  [facility] Center[facility]AV[facility],Tayt Moyers  Subjective/Objective Assessment:   LEFT TOTAL HIP ARTHROPLASTY ANTERIOR APPROACH     Action/Plan:   Anticipated DC Date:  04/29/2014   Anticipated DC Plan:  HOME W HOME HEALTH SERVICES      DC Planning Services  CM consult      [facility]   Choice offered to / List presented to:  C-1 Patient   DME arranged  [facility]  3-N-1      DME agency  [facility]     HH arranged  HH-2 PT      [facility]  [facility]   Status of service:  Completed, signed off Medicare Important Message given?   (If response is "NO", the following Medicare IM given date fields will be blank) Date Medicare IM given:   Medicare IM given by:   Date Additional Medicare IM given:   Additional Medicare IM given by:    Discharge Disposition:  HOME W HOME HEALTH SERVICES  Per UR Regulation:    If discussed at Long Length of Stay Meetings, dates discussed:    Comments:  04/28/2014 1600 NCM spoke to pt and gave permission to speak to girlfriend, Sherley Boundslena Nazaio # [phone]. States he wants to go home with her for Park week to get stronger then he will go home. [facility]. [facility] of change in dc. Provided [facility] with address for girlfriend,  [address] [place] [address]  Isidoro DonningAlesia Haruto Demaria RN CCM Case Mgmt phone [phone]  04/28/2014 1500 NCM [facility] and they can accept referral. Requested facesheet, orders and H&P. Left message for MD to sign orders. NCM will fax dc summary on 04/29/2014 to [facility], fax [phone].  04/27/2014 1200 NCM spoke to pt and [facility]. Pt states he did not have Park preference. Pt requested RW and 3n1 for home. [facility] notified for DME for  home. Isidoro DonningAlesia Cayton Cuevas RN CCM Case Mgmt phone [phone]

## 2014-04-28 NOTE — Progress Notes (Addendum)
Subjective: 2 Days Post-Op Procedure(s) (LRB): LEFT TOTAL HIP ARTHROPLASTY ANTERIOR APPROACH (Left) Patient reports pain as moderate.    Objective: Vital signs in last 24 hours: Temp:  [98.1 F (36.7 C)-98.8 F (37.1 C)] 98.1 F (36.7 C) (07/19 0654) Pulse Rate:  [82-93] 93 (07/19 0654) Resp:  [16-18] 18 (07/19 0654) BP: (120-127)/(62-77) 120/77 mmHg (07/19 0654) SpO2:  [93 %-96 %] 94 % (07/19 0654)  Intake/Output from previous day: 07/18 0701 - 07/19 0700 In: 3088.8 [P.O.:1920; I.V.:1168.8] Out: 2100 [Urine:2100] Intake/Output this shift:     Recent Labs  04/27/14 0642 04/28/14 0538  HGB 11.1* 11.3*    Recent Labs  04/27/14 0642 04/28/14 0538  WBC 8.8 11.8*  RBC 3.56* 3.64*  HCT 33.0* 32.9*  PLT 161 151    Recent Labs  04/27/14 0642  NA 135*  K 4.0  CL 97  CO2 28  BUN 11  CREATININE 0.64  GLUCOSE 143*  CALCIUM 8.6    Recent Labs  04/26/14 0805  INR 1.01    Neurologically intact Compartment soft  Assessment/Plan: 2 Days Post-Op Procedure(s) (LRB): LEFT TOTAL HIP ARTHROPLASTY ANTERIOR APPROACH (Left) Up with therapy, home tomorrow. Left knee effusion slowing him down some. Slowly improving with mobility  YATES,MARK C 04/28/2014, 9:20 AM

## 2014-04-28 NOTE — Progress Notes (Signed)
Physical Therapy Treatment Patient Details Name: Isaiah Park MRN: [id number] DOB: 12/08/1960 Today's Date: 04/28/2014    History of Present Illness L THR - ant dir    PT Comments    Plan is for d/c tomorrow. Will need to practice stairs.   Follow Up Recommendations  Home health PT     Equipment Recommendations  Rolling walker with 5" wheels    Recommendations for Other Services OT consult     Precautions / Restrictions Precautions Precautions: Fall Restrictions Weight Bearing Restrictions: No Other Position/Activity Restrictions: WBAT    Mobility  Bed Mobility                  Transfers Overall transfer level: Needs assistance Equipment used: Rolling walker (2 wheeled) Transfers: Sit to/from Stand Sit to Stand: Min guard Stand pivot transfers: Min guard       General transfer comment: close guard for safety  Ambulation/Gait Ambulation/Gait assistance: Min guard Ambulation Distance (Feet): 65 Feet Assistive device: Rolling walker (2 wheeled) Gait Pattern/deviations: Step-to pattern;Antalgic;Decreased stance time - left;Decreased stride length     General Gait Details: distance limited by L knee pain-pt rates 6/10.    Stairs            Wheelchair Mobility    Modified Rankin (Stroke Patients Only)       Balance                                    Cognition Arousal/Alertness: Awake/alert Behavior During Therapy: WFL for tasks assessed/performed Overall Cognitive Status: Within Functional Limits for tasks assessed                      Exercises Total Joint Exercises Ankle Circles/Pumps: AROM;Both;10 reps;Seated Quad Sets: AROM;Both;10 reps;Seated Heel Slides: AAROM;Left;10 reps;Seated Hip ABduction/ADduction: AAROM;Left;10 reps;Seated    General Comments        Pertinent Vitals/Pain 6/10 L knee.     Home Living                      Prior Function            PT Goals (current goals can  now be found in the care plan section) Progress towards PT goals: Progressing toward goals (slowly)    Frequency  7X/week    PT Plan Current plan remains appropriate    Co-evaluation             End of Session   Activity Tolerance: Patient limited by pain;Patient limited by fatigue Patient left: in chair;with call bell/phone within reach;with family/visitor present     Time: 7829-56211524-1544 PT Time Calculation (min): 20 min  Charges:  $Gait Training: 8-22 mins                    G Codes:      Rebeca AlertJannie Weslyn Holsonback, MPT Pager: [phone]

## 2014-04-28 NOTE — Progress Notes (Signed)
Physical Therapy Treatment Patient Details Name: Isaiah CandleKeith A Laubach MRN: [id number] DOB: 1960/10/31 Today's Date: 04/28/2014    History of Present Illness L THR - ant dir    PT Comments    Progressing with ambulation. Still having issues with L knee.   Follow Up Recommendations  Home health PT     Equipment Recommendations  Rolling walker with 5" wheels    Recommendations for Other Services OT consult     Precautions / Restrictions Precautions Precautions: Fall Restrictions Weight Bearing Restrictions: No Other Position/Activity Restrictions: WBAT    Mobility  Bed Mobility               General bed mobility comments: friend assisted bil Legs--HOB was raised  Transfers Overall transfer level: Needs assistance Equipment used: Rolling walker (2 wheeled) Transfers: Sit to/from Stand Sit to Stand: Min guard Stand pivot transfers: Min guard       General transfer comment: cues for UE placement, but pt pushed up on walker from high bed  Ambulation/Gait Ambulation/Gait assistance: Min guard Ambulation Distance (Feet): 80 Feet Assistive device: Rolling walker (2 wheeled) Gait Pattern/deviations: Step-to pattern;Decreased stride length;Antalgic     General Gait Details: Improved activity tolerance this session.    Stairs            Wheelchair Mobility    Modified Rankin (Stroke Patients Only)       Balance                                    Cognition Arousal/Alertness: Awake/alert Behavior During Therapy: WFL for tasks assessed/performed Overall Cognitive Status: Within Functional Limits for tasks assessed                      Exercises      General Comments        Pertinent Vitals/Pain 5/10 L knee. Some mild discomfort L hip.     Home Living Family/patient expects to be discharged to:: Private residence Living Arrangements: Alone Available Help at Discharge: Family;Friend(s) Type of Home: House Home Access:  Stairs to enter Entrance Stairs-Rails: Right Home Layout: One level Home Equipment: Cane - single point Additional Comments: standard 3:1 delivered to room; need to make sure it fits him    Prior Function Level of Independence: Independent          PT Goals (current goals can now be found in the care plan section) Acute Rehab PT Goals Patient Stated Goal: Home Progress towards PT goals: Progressing toward goals    Frequency  7X/week    PT Plan Current plan remains appropriate    Co-evaluation             End of Session   Activity Tolerance: Patient tolerated treatment well Patient left:  (wife assisting pt to/from bathroom at pt's request)     Time: 4098-11911219-1235 PT Time Calculation (min): 16 min  Charges:  $Gait Training: 8-22 mins                    G Codes:      Rebeca AlertJannie Alvar Malinoski, MPT Pager: [phone]

## 2014-04-28 NOTE — Plan of Care (Signed)
Problem: Phase III Progression Outcomes Goal: Anticoagulant follow-up in place Outcome: Not Applicable Date Met:  01/65/53 asa

## 2014-04-28 NOTE — Progress Notes (Signed)
CARE MANAGEMENT NOTE 04/28/2014  Patient:  Eddie CandleYOUNG,Sacha A   Account Number:  [id number]  Date Initiated:  04/27/2014  Documentation initiated by:  [facility],Loeta Herst  Subjective/Objective Assessment:   LEFT TOTAL HIP ARTHROPLASTY ANTERIOR APPROACH     Action/Plan:   Anticipated DC Date:  04/29/2014   Anticipated DC Plan:  HOME W HOME HEALTH SERVICES      DC Planning Services  CM consult      [facility]   Choice offered to / List presented to:  C-1 Patient   DME arranged  [facility]      DME agency  [facility]     HH arranged  HH-2 PT      HH [facility]   Status of service:  Completed, signed off Medicare Important Message given?   (If response is "NO", the following Medicare IM given date fields will be blank) Date Medicare IM given:   Medicare IM given by:   Date Additional Medicare IM given:   Additional Medicare IM given by:    Discharge Disposition:  HOME W HOME HEALTH SERVICES  Per UR Regulation:    If discussed at Long Length of Stay Meetings, dates discussed:    Comments:  04/28/2014 1500 NCM [facility] and [facility] can [facility]. Requested facesheet, orders and H&P. Left message for MD to sign orders. NCM will fax dc summary on 04/29/2014 to [facility], fax [phone].  04/27/2014 1200 NCM spoke to pt and [facility]. Pt states he did not have a preference. Pt requested RW and 3n1 for home. [facility] notified for DME for home. Isidoro DonningAlesia Malynda Smolinski RN CCM Case Mgmt phone [phone]

## 2014-04-29 LAB — CBC
HEMATOCRIT: 30.4 % — AB (ref 39.0–52.0)
HEMOGLOBIN: 10.4 g/dL — AB (ref 13.0–17.0)
MCH: 31.3 pg (ref 26.0–34.0)
MCHC: 34.2 g/dL (ref 30.0–36.0)
MCV: 91.6 fL (ref 78.0–100.0)
Platelets: 183 10*3/uL (ref 150–400)
RBC: 3.32 MIL/uL — AB (ref 4.22–5.81)
RDW: 12.5 % (ref 11.5–15.5)
WBC: 10.5 10*3/uL (ref 4.0–10.5)

## 2014-04-29 LAB — GLUCOSE, CAPILLARY: Glucose-Capillary: 154 mg/dL — ABNORMAL HIGH (ref 70–99)

## 2014-04-29 MED ORDER — OXYCODONE-ACETAMINOPHEN 5-325 MG PO TABS: 1.0000 | ORAL_TABLET | ORAL | Status: DC | PRN

## 2014-04-29 MED ORDER — METHOCARBAMOL 500 MG PO TABS: 500.0000 mg | ORAL_TABLET | Freq: Four times a day (QID) | ORAL | Status: AC | PRN

## 2014-04-29 MED ORDER — ASPIRIN 325 MG PO TBEC: 325.0000 mg | DELAYED_RELEASE_TABLET | Freq: Two times a day (BID) | ORAL | Status: AC

## 2014-04-29 MED ORDER — BISACODYL 10 MG RE SUPP: 10.0000 mg | Freq: Every day | RECTAL | Status: DC | PRN

## 2014-04-29 MED ORDER — ONDANSETRON 4 MG PO TBDP: 4.0000 mg | ORAL_TABLET | Freq: Three times a day (TID) | ORAL | Status: AC | PRN

## 2014-04-29 NOTE — Discharge Summary (Signed)
Patient ID: Isaiah Park MRN: [id number] DOB/AGE: [birth year] 53 y.o.  Admit date: 04/26/2014 Discharge date: 04/29/2014  Admission Diagnoses:  Principal Problem:   Arthritis of left hip Active Problems:   Status post THR (total hip replacement)   Discharge Diagnoses:  Same  Past Medical History  Diagnosis Date  . Hypertension   . Diabetes mellitus without complication   . Headache(784.0)     migraines  . Arthritis     Surgeries: Procedure(s): LEFT TOTAL HIP ARTHROPLASTY ANTERIOR APPROACH on 04/26/2014   Consultants:    Discharged Condition: Improved  Hospital Course: Isaiah Park is an 53 y.o. male who was admitted 04/26/2014 for operative treatment ofArthritis of left hip. Patient has severe unremitting pain that affects sleep, daily activities, and work/hobbies. After pre-op clearance the patient was taken to the operating room on 04/26/2014 and underwent  Procedure(s): LEFT TOTAL HIP ARTHROPLASTY ANTERIOR APPROACH.    Patient was given perioperative antibiotics: Anti-infectives   Start     Dose/Rate Route Frequency Ordered Stop   04/26/14 1530  ceFAZolin (ANCEF) IVPB 2 g/50 mL premix     2 g 100 mL/hr over 30 Minutes Intravenous Every 6 hours 04/26/14 1450 04/26/14 2136   04/26/14 0739  ceFAZolin (ANCEF) 3 g in dextrose 5 % 50 mL IVPB     3 g 160 mL/hr over 30 Minutes Intravenous On call to O.R. 04/26/14 0739 04/26/14 1015       Patient was given sequential compression devices, early ambulation, and chemoprophylaxis to prevent DVT.  Patient benefited maximally from hospital stay and there were no complications.    Recent vital signs: Patient Vitals for the past 24 hrs:  BP Temp Temp src Pulse Resp SpO2  04/29/14 0519 122/71 mmHg 98.4 F (36.9 C) Oral 85 18 95 %  04/28/14 2140 134/74 mmHg 98.5 F (36.9 C) Oral 88 16 94 %  04/28/14 1514 120/78 mmHg 97.8 F (36.6 C) Oral 96 18 94 %     Recent laboratory studies:  Recent Labs  04/26/14 0805  04/27/14 0642  04/28/14 0538 04/29/14 0430  WBC  --   < > 8.8 11.8* 10.5  HGB  --   < > 11.1* 11.3* 10.4*  HCT  --   < > 33.0* 32.9* 30.4*  PLT  --   < > 161 151 183  NA  --   --  135*  --   --   K  --   --  4.0  --   --   CL  --   --  97  --   --   CO2  --   --  28  --   --   BUN  --   --  11  --   --   CREATININE  --   --  0.64  --   --   GLUCOSE  --   --  143*  --   --   INR 1.01  --   --   --   --   CALCIUM  --   --  8.6  --   --   < > = values in this interval not displayed.   Discharge Medications:     Medication List    STOP taking these medications       GOODY HEADACHE PO     HYDROcodone-acetaminophen 5-325 MG per tablet  Commonly known as:  NORCO/VICODIN      TAKE these medications  aspirin 325 MG EC tablet  Take 1 tablet (325 mg total) by mouth 2 (two) times daily after a meal.     lisinopril-hydrochlorothiazide 20-12.5 MG per tablet  Commonly known as:  PRINZIDE,ZESTORETIC  Take 1 tablet by mouth daily.     metFORMIN 750 MG 24 hr tablet  Commonly known as:  GLUCOPHAGE-XR  Take 750 mg by mouth 2 (two) times daily.     methocarbamol 500 MG tablet  Commonly known as:  ROBAXIN  Take 1 tablet (500 mg total) by mouth every 6 (six) hours as needed for muscle spasms.     ondansetron 4 MG disintegrating tablet  Commonly known as:  ZOFRAN ODT  Take 1 tablet (4 mg total) by mouth every 8 (eight) hours as needed for nausea or vomiting.     oxyCODONE-acetaminophen 5-325 MG per tablet  Commonly known as:  ROXICET  Take 1-2 tablets by mouth every 4 (four) hours as needed for severe pain.     simvastatin 10 MG tablet  Commonly known as:  ZOCOR  Take 10 mg by mouth daily.     SUMAtriptan 100 MG tablet  Commonly known as:  IMITREX  Take 100 mg by mouth every 2 (two) hours as needed for migraine or headache. May repeat in 2 hours if headache persists or recurs.     verapamil 240 MG (CO) 24 hr tablet  Commonly known as:  COVERA HS  Take 240 mg by mouth at bedtime.         Diagnostic Studies: Dg Hip Complete Left  04/26/2014   CLINICAL DATA:  Total hip replacement.  EXAM: LEFT HIP - COMPLETE 2+ VIEW  COMPARISON:  None.  FINDINGS: Two intraoperative views. These demonstrate left hip arthroplasty, without acute hardware complication or periprosthetic fracture.  IMPRESSION: Intraoperative imaging of left hip arthroplasty.   Electronically Signed   By: Jeronimo GreavesKyle  Talbot M.D.   On: 04/26/2014 11:50   Dg Pelvis Portable  04/26/2014   CLINICAL DATA:  postop  EXAM: PORTABLE PELVIS 1-2 VIEWS  COMPARISON:  None.  FINDINGS: Components of left hip arthroplasty project in expected location. No fracture or dislocation. The upper pelvis including iliac wings excluded.  IMPRESSION: Negative post left hip arthroplasty.   Electronically Signed   By: Oley Balmaniel  Hassell M.D.   On: 04/26/2014 13:09   Dg Hip Portable 1 View Left  04/26/2014   CLINICAL DATA:  postop  EXAM: PORTABLE LEFT HIP - 1 VIEW  COMPARISON:  None.  FINDINGS: Single cross-table lateral view show femoral and acetabular components of left hip arthroplasty project in expected location. No evidence of fracture or dislocation. Anterior skin staples.  IMPRESSION: 1. Left hip arthroplasty without apparent complication.   Electronically Signed   By: Oley Balmaniel  Hassell M.D.   On: 04/26/2014 13:09   Dg C-arm 61-120 Min-no Report  04/26/2014   CLINICAL DATA: anterior left hip   C-ARM 61-120 MINUTES  Fluoroscopy was utilized by the requesting physician.  No radiographic  interpretation.     Disposition: to home      Discharge Instructions   Discharge patient    Complete by:  As directed      Weight bearing as tolerated    Complete by:  As directed            Follow-up Information   [facility] (Home Health Physical Therapy)    Contact information:   [phone]      [facility]Green[facility] Rarick  Y, MD In 2 weeks.   Specialty:  Orthopedic Surgery   Contact information:    [address] [place] [address] [phone]        Signed: Kathryne Hitch 04/29/2014, 7:16 AM

## 2014-04-29 NOTE — Progress Notes (Signed)
[facility] the following services: RW and Commode  If patient discharges after hours, please call [phone].   Isaiah HamperLecretia Williamson 04/29/2014, 10:20 AM

## 2014-04-29 NOTE — Progress Notes (Signed)
Physical Therapy Treatment Patient Details Name: Isaiah Park MRN: [id number] DOB: April 02, 1961 Today's Date: 04/29/2014    History of Present Illness L THR - ant dir    PT Comments    POD # 3 pt eager to D/C to home but having issues of vomiting this am.  Pt and sign other wanted to have pt practice stairs prior to D/C.  Issued pt a single crutch and practiced stairs using one R rail and one crutch.  Pt c/o L knee pain > L hip pain.  "They tore my knee up" in surgery.  Pt tolerated session well and is ready for D/c to home.    Follow Up Recommendations  Home health PT     Equipment Recommendations  Rolling walker with 5" wheels    Recommendations for Other Services       Precautions / Restrictions Precautions Precautions: Fall Restrictions Weight Bearing Restrictions: No Other Position/Activity Restrictions: WBAT    Mobility  Bed Mobility               General bed mobility comments: Pt OOB in recliner  Transfers Overall transfer level: Needs assistance Equipment used: Rolling walker (2 wheeled) Transfers: Sit to/from Stand Sit to Stand: Supervision         General transfer comment: close guard for safety and increased time  Ambulation/Gait Ambulation/Gait assistance: Min guard;Supervision Ambulation Distance (Feet): 22 Feet Assistive device: Rolling walker (2 wheeled) Gait Pattern/deviations: Step-to pattern;Decreased stance time - left Gait velocity: decr   General Gait Details: distance limited by L knee pain-pt rates 6/10.    Stairs Stairs: Yes Stairs assistance: Min guard Stair Management: One rail Right;Step to pattern;Forwards;With crutches Number of Stairs: 4 General stair comments: with spouse present practiced stairs using one crutch and one rail  Wheelchair Mobility    Modified Rankin (Stroke Patients Only)       Balance                                    Cognition                             Exercises      General Comments        Pertinent Vitals/Pain C/o L knee pain > L hip pain    Home Living                      Prior Function            PT Goals (current goals can now be found in the care plan section) Progress towards PT goals: Progressing toward goals    Frequency  7X/week    PT Plan      Co-evaluation             End of Session Equipment Utilized During Treatment: Gait belt Activity Tolerance: Patient tolerated treatment well Patient left: in chair;with call bell/phone within reach;with family/visitor present     Time: 1135-1200 PT Time Calculation (min): 25 min  Charges:  $Gait Training: 8-22 mins $Therapeutic Activity: 8-22 mins                    G Codes:      Felecia ShellingLori Omolola Mittman  PTA [facility]  Acute  Rehab Pager      [phone]

## 2014-04-29 NOTE — Discharge Instructions (Signed)
Pickup stool softener for constipation. °Weight Bearing as tolerated  °No hip precautions °Progress activities slowly °Expect hip and possible knee soreness and swelling. Apply heat or ice as needed. °Keep dressing clean dry and intact, may shower with dressing intact. On Friday remove dressing, incision can get wet. After showering apply clean dressing. °

## 2014-04-29 NOTE — Progress Notes (Signed)
Occupational Therapy Treatment Patient Details Name: Isaiah Park MRN: [id number] DOB: 1961-04-20 Today's Date: 04/29/2014    History of present illness L THR - ant dir         Equipment Recommendations  3 in 1 bedside comode (WIDE)           Mobility Bed Mobility          pt in chair        Transfers Overall transfer level: Needs assistance Equipment used: Rolling walker (2 wheeled)   Sit to Stand: Min guard                  ADL                               Toileting- Clothing Manipulation and Hygiene: Minimal assistance;Sit to/from stand         General ADL Comments: Insurance did approve wider [facility]ts home.  This commode will work much better for this pt.  Wife appreciative of help.                             Prior Functioning/Environment              Frequency Min 2X/week     Progress Toward Goals  OT Goals(current goals can now be found in the care plan section)   progressing            End of Session     Activity Tolerance Patient tolerated treatment well   Patient Left in chair;with call bell/phone within reach           Time: 1110-1133 OT Time Calculation (min): 23 min  Charges: OT General Charges $OT Visit: 1 Procedure OT Treatments $Self Care/Home Management : 23-37 mins  Isaiah Park, Isaiah Park 04/29/2014, 12:21 PM

## 2014-04-30 ENCOUNTER — Encounter (HOSPITAL_COMMUNITY): Payer: Self-pay | Admitting: Orthopaedic Surgery

## 2019-03-12 ENCOUNTER — Ambulatory Visit: Payer: Self-pay | Admitting: Physician Assistant

## 2019-03-19 ENCOUNTER — Other Ambulatory Visit: Payer: Self-pay

## 2019-03-19 ENCOUNTER — Ambulatory Visit (INDEPENDENT_AMBULATORY_CARE_PROVIDER_SITE_OTHER): Payer: BLUE CROSS/BLUE SHIELD | Admitting: Physician Assistant

## 2019-03-19 ENCOUNTER — Ambulatory Visit: Payer: BLUE CROSS/BLUE SHIELD

## 2019-03-19 ENCOUNTER — Ambulatory Visit: Payer: Self-pay

## 2019-03-19 ENCOUNTER — Encounter: Payer: Self-pay | Admitting: Physician Assistant

## 2019-03-19 DIAGNOSIS — M25551 Pain in right hip: Secondary | ICD-10-CM

## 2019-03-19 DIAGNOSIS — M5441 Lumbago with sciatica, right side: Secondary | ICD-10-CM

## 2019-03-19 MED ORDER — CYCLOBENZAPRINE HCL 10 MG PO TABS: 10.0000 mg | ORAL_TABLET | Freq: Every day | ORAL | 0 refills | Status: AC

## 2019-03-19 NOTE — Progress Notes (Signed)
Office Visit Note:  Patient: Isaiah Park           Date of Birth: 01-Nov-1960           MRN: [id number] Visit Date: 03/19/2019              Requested by: Glenis SmokerJaswani, Sanjay M, MD [address] [place], [place] [id number] PCP: Glenis SmokerJaswani, Sanjay M, MD   Assessment & Plan: Visit Diagnoses:  1. Pain in right hip   2. Acute right-sided low back pain with right-sided sciatica     Plan: We will send him to physical therapy for core strengthening, hamstring stretching exercises, IT band stretching, home exercise program and modalities.  Placed him on a muscle relaxer to take at night.  Moist heat to the back.  Discussed with him some IT band exercises he can do on exam before therapy.  We will see him back in 2 weeks check his progress lack of.  Questions encouraged and answered at length.  Follow-Up Instructions: Return in about 2 weeks (around 04/02/2019).   Orders:  Orders Placed This Encounter  Procedures  . XR HIP UNILAT W OR W/O PELVIS 2-3 VIEWS RIGHT  . XR Lumbar Spine 2-3 Views   No orders of the defined types were placed in this encounter.     Procedures: No procedures performed   Clinical Data: No additional findings.   Subjective: Chief Complaint  Patient presents with  . Right Hip - Pain  . Right Leg - Pain    HPI Isaiah Park is a 58 year old male who is well-known to our department service.  He underwent a left total hip arthroplasty 5 years ago.  Left hip overall is doing well he states that he has a catching-like sensation whenever he walks for prolonged period of time but otherwise is doing very well.  Comes in mainly due to right hip and leg pain is been ongoing since March.  This is not noted any particular incident in which he may have injured the back or his leg.  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  Sometimes has some right buttocks pain that radiates down the leg past the knee.  No low back pain.  He denies any bowel bladder  dysfunction.  No saddle anesthesia like symptoms.  States pain does awaken him sometimes at night.  States pain is so bad that he has difficulty ambulating with pain walks with a limp.  Has been taking ibuprofen and Goody powders.  He is diabetic reports his last hemoglobin A1c was 8 something.  Is been working on getting his hemoglobin A1c back down has been dieting trying to lose weight.  Review of Systems No fevers or chills. See HPI  Objective: Vital Signs: There were no vitals taken for this visit.  Physical Exam Constitutional:      Appearance: He is not ill-appearing or diaphoretic.  Pulmonary:     Effort: Pulmonary effort is normal.  Neurological:     Mental Status: He is alert and oriented to person, place, and time.  Psychiatric:        Behavior: Behavior normal.    Ortho Exam Left hip good range of motion without pain.  Nontender over the left trochanteric region.  Right hip is slight discomfort with internal rotation and tenderness over the greater trochanteric region.  5-5 strength throughout lower extremities against resistance.  Tight hamstrings bilaterally.  Straight leg raise negative bilaterally.  Forward flexion is able  to come within 6 inches of touching his toes.  He has limited extension with no discomfort lumbar spine.  Specialty Comments:  No specialty comments available.  Imaging: Xr Hip Unilat W Or W/o Pelvis 2-3 Views Right  Result Date: 03/19/2019 AP pelvis lateral view right hip: Status post left total hip arthroplasty without any evidence of complication.  No acute fracture.  Right hip is well located.  Hip joints well maintained.  No bony abnormalities otherwise.  Xr Lumbar Spine 2-3 Views  Result Date: 03/19/2019 Lumbar spine AP lateral views: No acute fractures.  Slight loss of lordotic curvature.  Slight retrolisthesis of L2 on L3. Disc space narrowing at L5-S1.    PMFS History: Patient Active Problem List   Diagnosis Date Noted  . Arthritis of  left hip 04/26/2014  . Status post THR (total hip replacement) 04/26/2014   Past Medical History:  Diagnosis Date  . Arthritis   . Diabetes mellitus without complication ([place])   . Headache(784.0)    migraines  . Hypertension     History reviewed. No pertinent family history.  Past Surgical History:  Procedure Laterality Date  . HERNIA REPAIR     left inguinal   . TOTAL HIP ARTHROPLASTY Left 04/26/2014   Procedure: LEFT TOTAL HIP ARTHROPLASTY ANTERIOR APPROACH;  Surgeon: Mcarthur Rossetti, MD;  Location: [facility];  Service: Orthopedics;  Laterality: Left;   Social History   Occupational History  . Not on file  Tobacco Use  . Smoking status: Never Smoker  . Smokeless tobacco: Never Used  Substance and Sexual Activity  . Alcohol use: Yes    Comment: occassionally beer  . Drug use: No  . Sexual activity: Not on file

## 2019-03-19 NOTE — Addendum Note (Signed)
Addended by: Precious Bard on: 03/19/2019 04:02 PM   Modules accepted: Orders

## 2019-04-05 ENCOUNTER — Ambulatory Visit: Payer: BLUE CROSS/BLUE SHIELD | Admitting: Physician Assistant

## 2019-04-16 ENCOUNTER — Ambulatory Visit: Payer: BC Managed Care – PPO | Admitting: Physician Assistant

## 2019-08-02 ENCOUNTER — Other Ambulatory Visit: Payer: Self-pay

## 2019-08-02 ENCOUNTER — Ambulatory Visit (INDEPENDENT_AMBULATORY_CARE_PROVIDER_SITE_OTHER): Payer: BC Managed Care – PPO | Admitting: Physician Assistant

## 2019-08-02 ENCOUNTER — Encounter: Payer: Self-pay | Admitting: Physician Assistant

## 2019-08-02 DIAGNOSIS — M4807 Spinal stenosis, lumbosacral region: Secondary | ICD-10-CM

## 2019-08-02 DIAGNOSIS — M5440 Lumbago with sciatica, unspecified side: Secondary | ICD-10-CM

## 2019-08-02 DIAGNOSIS — G8929 Other chronic pain: Secondary | ICD-10-CM

## 2019-08-02 NOTE — Progress Notes (Signed)
Office Visit Note   Patient: Isaiah Park           Date of Birth: 1961-06-01           MRN: [id number] Visit Date: 08/02/2019              Requested by: Glenis Smoker, MD [address] [place],  [place] [address] PCP: Glenis Smoker, MD   Assessment & Plan: Visit Diagnoses:  1. Chronic midline low back pain with sciatica, sciatica laterality unspecified     Plan: We will have him undergo an MRI of his lumbar spine to rule out HNP as a source of his right leg radicular symptoms.  Have him follow-up after the MRI to go over results and discuss further treatment.  Questions encouraged and answered at length.  He has ibuprofen for pain.  He will require an open MRI due to size and slightly claustrophobic.  Follow-Up Instructions: Return After MRI.   Orders:  No orders of the defined types were placed in this encounter.  No orders of the defined types were placed in this encounter.     Procedures: No procedures performed   Clinical Data: No additional findings.   Subjective: Chief Complaint  Patient presents with  . Right Knee - Pain    HPI Isaiah Park 58 year old male well-known service comes in today due to low back pain and radicular symptoms down his right leg.  His pain began back in March.  However he has been caring for his mother and spinal neglected his pain needs.  He is now having pain that is running down the posterior thigh down into the posterior aspect of his right leg.  Describes his pain electrical-like tingling sensation.  He does have neuropathy in both feet and is diabetic.  He has been taking Flexeril and ibuprofen.  He states the ibuprofen takes the edge of.  Flexeril he does not feel it helps at all.  He has had no bowel bladder dysfunction.  No saddle anesthesia like symptoms.  Pain does awaken him. Review of Systems See HPI otherwise negative or noncontributory.  Objective: Vital Signs: There were no vitals taken for this visit.  Physical Exam  Constitutional:      Appearance: He is not ill-appearing or diaphoretic.  Pulmonary:     Breath sounds: Normal breath sounds.  Neurological:     Mental Status: He is alert and oriented to person, place, and time.  Psychiatric:        Mood and Affect: Mood normal.        Behavior: Behavior normal.     Ortho Exam Lower extremities 5 out of 5 strength throughout against resistance.  Deep tendon reflexes are 2+ at the knees and ankles and equal and symmetric.  Negative straight leg raise bilaterally.  He has tight hamstrings bilaterally.  Forward flexion is limited, and within 6 to 8 inches of being to touch his toes.  Also limited extension of the lumbar spine.  Specialty Comments:  No specialty comments available.  Imaging: No results found.   PMFS History: Patient Active Problem List   Diagnosis Date Noted  . Arthritis of left hip 04/26/2014  . Status post THR (total hip replacement) 04/26/2014   Past Medical History:  Diagnosis Date  . Arthritis   . Diabetes mellitus without complication (HCC)   . Headache(784.0)    migraines  . Hypertension     History reviewed. No pertinent family history.  Past Surgical History:  Procedure Laterality Date  . HERNIA REPAIR     left inguinal   . TOTAL HIP ARTHROPLASTY Left 04/26/2014   Procedure: LEFT TOTAL HIP ARTHROPLASTY ANTERIOR APPROACH;  Surgeon: Mcarthur Rossetti, MD;  Location: [facility];  Service: Orthopedics;  Laterality: Left;   Social History   Occupational History  . Not on file  Tobacco Use  . Smoking status: Never Smoker  . Smokeless tobacco: Never Used  Substance and Sexual Activity  . Alcohol use: Yes    Comment: occassionally beer  . Drug use: No  . Sexual activity: Not on file

## 2019-08-02 NOTE — Addendum Note (Signed)
Addended by: Michae Kava B on: 08/02/2019 05:13 PM   Modules accepted: Orders

## 2019-08-27 ENCOUNTER — Other Ambulatory Visit: Payer: Self-pay

## 2019-08-27 ENCOUNTER — Ambulatory Visit
Admission: RE | Admit: 2019-08-27 | Discharge: 2019-08-27 | Disposition: A | Discharge status: Home / Self Care | Payer: BC Managed Care – PPO | Source: Ambulatory Visit | Attending: Physician Assistant | Admitting: Physician Assistant

## 2019-08-27 DIAGNOSIS — M4807 Spinal stenosis, lumbosacral region: Secondary | ICD-10-CM

## 2019-08-29 ENCOUNTER — Other Ambulatory Visit: Payer: Self-pay | Admitting: Radiology

## 2019-08-29 DIAGNOSIS — M5440 Lumbago with sciatica, unspecified side: Secondary | ICD-10-CM

## 2019-08-29 DIAGNOSIS — G8929 Other chronic pain: Secondary | ICD-10-CM

## 2019-08-30 ENCOUNTER — Other Ambulatory Visit: Payer: Self-pay | Admitting: Orthopedic Surgery

## 2019-08-30 ENCOUNTER — Ambulatory Visit: Payer: BC Managed Care – PPO | Admitting: Physician Assistant

## 2019-09-26 ENCOUNTER — Ambulatory Visit (INDEPENDENT_AMBULATORY_CARE_PROVIDER_SITE_OTHER): Payer: BC Managed Care – PPO | Admitting: Physical Medicine and Rehabilitation

## 2019-09-26 ENCOUNTER — Ambulatory Visit: Payer: Self-pay

## 2019-09-26 ENCOUNTER — Encounter: Payer: Self-pay | Admitting: Physical Medicine and Rehabilitation

## 2019-09-26 ENCOUNTER — Other Ambulatory Visit: Payer: Self-pay

## 2019-09-26 VITALS — BP 148/83 | HR 75

## 2019-09-26 DIAGNOSIS — M5416 Radiculopathy, lumbar region: Secondary | ICD-10-CM

## 2019-09-26 MED ORDER — METHYLPREDNISOLONE ACETATE 80 MG/ML IJ SUSP
80.0000 mg | Freq: Once | INTRAMUSCULAR | Status: AC
  Administered 2019-09-26: 80 mg

## 2019-09-26 NOTE — Progress Notes (Signed)
Pt states pain in the lower back and radiates into both legs all the way down. Pt states pain started about 6 months ago. Pt states walking and standing for a long period of time. Ibuprofen helps with pain.   .Numeric Pain Rating Scale and Functional Assessment Average Pain 5   In the last MONTH (on 0-10 scale) has pain interfered with the following?  1. General activity like being  able to carry out your everyday physical activities such as walking, climbing stairs, carrying groceries, or moving a chair?  Rating(6)   +Driver, -BT, -Dye Allergies.

## 2020-01-04 ENCOUNTER — Telehealth: Payer: Self-pay | Admitting: Physical Medicine and Rehabilitation

## 2020-01-04 NOTE — Telephone Encounter (Signed)
Patient would like to schedule "another round of injections." Bilateral L3 TF 09/26/2019. Ok to repeat if helped, same problem/side, and no new injury?

## 2020-01-04 NOTE — Telephone Encounter (Signed)
Is auth needed for [id number]? Scheduled for 4/20 with driver. 

## 2020-01-04 NOTE — Telephone Encounter (Signed)
Bil L3 tf esi

## 2020-01-08 NOTE — Telephone Encounter (Signed)
Spoke with [employer] representative Myesha L and she states no pa is needed for [id number].  Ref# [id number]

## 2020-01-29 ENCOUNTER — Ambulatory Visit: Payer: BC Managed Care – PPO | Admitting: Physical Medicine and Rehabilitation

## 2020-01-29 ENCOUNTER — Encounter: Payer: Self-pay | Admitting: Physical Medicine and Rehabilitation

## 2020-01-29 ENCOUNTER — Ambulatory Visit: Payer: Self-pay

## 2020-01-29 ENCOUNTER — Other Ambulatory Visit: Payer: Self-pay

## 2020-01-29 VITALS — BP 133/86 | HR 84

## 2020-01-29 DIAGNOSIS — M5416 Radiculopathy, lumbar region: Secondary | ICD-10-CM

## 2020-01-29 DIAGNOSIS — M48062 Spinal stenosis, lumbar region with neurogenic claudication: Secondary | ICD-10-CM | POA: Diagnosis not present

## 2020-01-29 DIAGNOSIS — M5441 Lumbago with sciatica, right side: Secondary | ICD-10-CM | POA: Diagnosis not present

## 2020-01-29 DIAGNOSIS — G8929 Other chronic pain: Secondary | ICD-10-CM | POA: Diagnosis not present

## 2020-01-29 MED ORDER — METHYLPREDNISOLONE ACETATE 80 MG/ML IJ SUSP: 40.0000 mg | Freq: Once | INTRAMUSCULAR | Status: DC

## 2020-01-29 MED ORDER — MELOXICAM 15 MG PO TABS: 15.0000 mg | ORAL_TABLET | Freq: Every day | ORAL | 0 refills | Status: DC

## 2020-01-29 NOTE — Progress Notes (Signed)
Numeric Pain Rating Scale and Functional Assessment Average Pain 6   In the last MONTH (on 0-10 scale) has pain interfered with the following?  1. General activity like being  able to carry out your everyday physical activities such as walking, climbing stairs, carrying groceries, or moving a chair?  Rating(8)    +Driver, -BT, -Dye Allergies.  

## 2020-01-31 ENCOUNTER — Encounter: Payer: Self-pay | Admitting: Physical Medicine and Rehabilitation

## 2020-01-31 NOTE — Progress Notes (Signed)
Isaiah Park - 59 y.o. male MRN [id number]  Date of birth: 04/30/1961  Office Visit Note: Visit Date: 01/29/2020 PCP: Lavonia Dana, MD Referred by: Lavonia Dana, MD  Subjective: Chief Complaint  Patient presents with  . Lower Back - Pain  . Right Leg - Pain   HPI: Isaiah Park is a 59 y.o. male who comes in today For evaluation and management of chronic worsening low back pain with occasional right buttock and leg pain.  He is status post bilateral L3 transforaminal injection in December 2020 with only mild relief of symptoms.  He states that the ibuprofen that he takes in the morning works much better than the shot did.  Reviewing fluoroscopic images showing some difficulty on the right injection in the foramen with good flow contrast on the left.  Patient reports worsening symptoms with standing walking and bending and twisting.  A lot of the work he does with standing and lifting and doing physical activity.  He will take ibuprofen in the morning.  He does have a muscle relaxer that he takes at times which is Flexeril.  He reports that that will help a great deal for most of the day.  Then the pain simply returns and is pretty problematic.  He rates his pain as a 6 out of 10 pain.  He denies any focal weakness no new trauma.  He is status post left total hip arthroplasty.  He is followed from orthopedic standpoint by Dr. Jean Rosenthal.  MRI is reviewed again with him today and reviewed below in the note.  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  He has not tried any other anti-inflammatory type medication.  He has had physical therapy.  He does some exercises but not like he should.  He denies any groin pain.  He has no pain with hip rotation on the right or getting out of cars.  He seems somewhat confused at times he is at times about the pain potentially being from his spine versus the hip because he had a prior hip issue on the left.  Review of  Systems  Musculoskeletal: Positive for back pain and joint pain.  All other systems reviewed and are negative.  Otherwise per HPI.  Assessment & Plan: Visit Diagnoses:  1. Lumbar radiculopathy   2. Spinal stenosis of lumbar region with neurogenic claudication   3. Chronic bilateral low back pain with right-sided sciatica     Plan: Findings:  At this point the patient reports greater relief with anti-inflammatory medication orally then with the injection.  He really does not like the idea of the injection but we did have some difficulty on the right which can be problematic because it does feel like we are probably taking longer on the injection then we need to.  Nonetheless he is doing well with anti-inflammatory and probably does have benefit had a good full dose of anti-inflammatory.  We can prescribe meloxicam for the next couple weeks or so.  His primary care doctor does know that he has been on ibuprofen.  He also reports taking Goody's headache powders this seems to help his migraines.  We discussed the use of anti-inflammatories along with high-dose aspirin as being problematic at his age.  He does not have any history of cardiac problems he does have some high blood pressure.  At this point though 2 weeks of meloxicam he is getting way off the ibuprofen and try to minimize  the Goody's powders.  Depending on relief would look at interlaminar epidural steroid injection.  He can call us back.    Meds & Orders:  Meds ordered this encounter  Medications  . DISCONTD: methylPREDNISolone acetate (DEPO-MEDROL) injection 40 mg  . meloxicam (MOBIC) 15 MG tablet    Sig: Take 1 tablet (15 mg total) by mouth daily. Take with food    Dispense:  30 tablet    Refill:  0    No orders of the defined types were placed in this encounter.   Follow-up: Return if symptoms worsen or fail to improve.   Procedures: No procedures performed  No notes on file   Clinical History: MRI LUMBAR SPINE WITHOUT  CONTRAST  TECHNIQUE: Multiplanar, multisequence MR imaging of the lumbar spine was performed. No intravenous contrast was administered.  COMPARISON:  Lumbar spine radiographs 03/19/2019  FINDINGS: Segmentation:  Standard.  Alignment:  Normal.  Vertebrae: No fracture, suspicious osseous lesion, or significant marrow edema. Mild chronic degenerative endplate changes at L5-S1.  Conus medullaris and cauda equina: Conus extends to the L1 level. Conus and cauda equina appear normal.  Paraspinal and other soft tissues: Unremarkable.  Disc levels:  Diffuse lumbar spinal canal narrowing due to congenitally short pedicles. Disc desiccation from L2-3 to L5-S1. Severe disc space narrowing at L5-S1 with mild narrowing at L4-5.  T12-L1: Mild facet and ligamentum flavum hypertrophy without disc herniation or stenosis.  L1-2: Mild facet and ligamentum flavum hypertrophy without disc herniation or stenosis.  L2-3: Mild disc bulging and mild facet and ligamentum flavum hypertrophy without significant stenosis.  L3-4: Circumferential disc bulging, a right paracentral and right subarticular disc protrusion, congenitally short pedicles, and mild facet and ligamentum flavum hypertrophy result in severe spinal stenosis, severe right greater than left lateral recess stenosis, and mild bilateral neural foraminal stenosis. Potential bilateral L4 nerve root impingement.  L4-5: Mild circumferential disc bulging, a small left paracentral disc protrusion, congenitally short pedicles, and mild facet and ligamentum flavum hypertrophy result in mild left greater than right lateral recess stenosis and mild left neural foraminal stenosis without significant spinal stenosis.  L5-S1: Mild circumferential disc bulging, endplate spurring, congenitally short pedicles, and mild facet hypertrophy result in mild bilateral neural foraminal stenosis without spinal stenosis.  IMPRESSION: 1.  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 2. Mild lateral recess and neural foraminal stenosis at L4-5. 3. Mild neural foraminal stenosis at L5-S1.   Electronically Signed   By: Sebastian Ache M.D.   On: 08/28/2019 09:38   He reports that he has never smoked. He has never used smokeless tobacco. No results for input(s): HGBA1C, LABURIC in the last 8760 hours.  Objective:  VS:  HT:    WT:   BMI:     BP:133/86  HR:84bpm  TEMP: ( )  RESP:  Physical Exam Vitals and nursing note reviewed.  Constitutional:      General: He is not in acute distress.    Appearance: Normal appearance. He is well-developed. He is obese.  HENT:     Head: Normocephalic and atraumatic.  Eyes:     Conjunctiva/sclera: Conjunctivae normal.     Pupils: Pupils are equal, round, and reactive to light.  Cardiovascular:     Rate and Rhythm: Normal rate.     Pulses: Normal pulses.     Heart sounds: Normal heart sounds.  Pulmonary:  Effort: Pulmonary effort is normal. No respiratory distress.  Musculoskeletal:     Cervical back: Normal range of motion and neck supple. No rigidity.     Right lower leg: No edema.     Left lower leg: No edema.     Comments: Ambulates without a forward flexed lumbar spine he does have pain with extension of the lumbar spine facet loading with rotation more pain trochanter good distal strength.  Skin:    General: Skin is warm and dry.     Findings: No erythema or rash.  Neurological:     General: No focal deficit present.     Mental Status: He is alert and oriented to person, place, and time.     Sensory: No sensory deficit.     Coordination: Coordination normal.     Gait: Gait normal.  Psychiatric:        Mood and Affect: Mood normal.        Behavior: Behavior normal.     Ortho Exam  Imaging: No  results found.  Past Medical/Family/Surgical/Social History: Medications & Allergies reviewed per EMR, new medications updated. Patient Active Problem List   Diagnosis Date Noted  . Arthritis of left hip 04/26/2014  . Status post THR (total hip replacement) 04/26/2014   Past Medical History:  Diagnosis Date  . Arthritis   . Diabetes mellitus without complication (HCC)   . Headache(784.0)    migraines  . Hypertension    History reviewed. No pertinent family history. Past Surgical History:  Procedure Laterality Date  . HERNIA REPAIR     left inguinal   . TOTAL HIP ARTHROPLASTY Left 04/26/2014   Procedure: LEFT TOTAL HIP ARTHROPLASTY ANTERIOR APPROACH;  Surgeon: Kathryne Hitch, MD;  Location: [facility];  Service: Orthopedics;  Laterality: Left;   Social History   Occupational History  . Not on file  Tobacco Use  . Smoking status: Never Smoker  . Smokeless tobacco: Never Used  Substance and Sexual Activity  . Alcohol use: Yes    Comment: occassionally beer  . Drug use: No  . Sexual activity: Not on file

## 2020-03-17 NOTE — Progress Notes (Signed)
Isaiah Park - 59 y.o. male MRN [id number]  Date of birth: Jul 29, 1961  Office Visit Note: Visit Date: 09/26/2019 PCP: Glenis Smoker, MD Referred by: Glenis Smoker, MD  Subjective: Chief Complaint  Patient presents with  . Lower Back - Pain  . Right Leg - Pain  . Left Leg - Pain   HPI:  Isaiah Park is a 59 y.o. male who comes in today At the request of Rexene Edison, PA-C for planned Bilateral L3-L4 lumbar transforaminal epidural steroid injection with fluoroscopic guidance.  The patient has failed conservative care including home exercise, medications, time and activity modification.  This injection will be diagnostic and hopefully therapeutic.  Please see requesting physician notes for further details and justification.  MRI reviewed with images and spine model.  MRI reviewed in the note below.   ROS Otherwise per HPI.  Assessment & Plan: Visit Diagnoses:  1. Lumbar radiculopathy     Plan: No additional findings.   Meds & Orders:  Meds ordered this encounter  Medications  . methylPREDNISolone acetate (DEPO-MEDROL) injection 80 mg    Orders Placed This Encounter  Procedures  . XR C-ARM NO REPORT  . Epidural Steroid injection    Follow-up: Return if symptoms worsen or fail to improve.   Procedures: No procedures performed  Lumbosacral Transforaminal Epidural Steroid Injection - Sub-Pedicular Approach with Fluoroscopic Guidance  Patient: Isaiah Park      Date of Birth: 07/13/1961 MRN: [id number] PCP: Glenis Smoker, MD      Visit Date: 09/26/2019   Universal Protocol:    Date/Time: 09/26/2019  Consent Given By: the patient  Position: PRONE  Additional Comments: Vital signs were monitored before and after the procedure. Patient was prepped and draped in the usual sterile fashion. The correct patient, procedure, and site was verified.   Injection Procedure Details:  Procedure Site One Meds Administered:  Meds ordered this encounter    Medications  . methylPREDNISolone acetate (DEPO-MEDROL) injection 80 mg    Laterality: Bilateral  Location/Site:  L3-L4  Needle size: 22 G  Needle type: Spinal  Needle Placement: Transforaminal  Findings:    -Comments: Excellent flow of contrast along the nerve and into the epidural space.  Right-sided flow was somewhat stenotic compared to the left.  Procedure Details: After squaring off the end-plates to get a true AP view, the C-arm was positioned so that an oblique view of the foramen as noted above was visualized. The target area is just inferior to the "nose of the scotty dog" or sub pedicular. The soft tissues overlying this structure were infiltrated with 2-3 ml. of 1% Lidocaine without Epinephrine.  The spinal needle was inserted toward the target using a "trajectory" view along the fluoroscope beam.  Under AP and lateral visualization, the needle was advanced so it did not puncture dura and was located close the 6 O'Clock position of the pedical in AP tracterory. Biplanar projections were used to confirm position. Aspiration was confirmed to be negative for CSF and/or blood. A 1-2 ml. volume of Isovue-250 was injected and flow of contrast was noted at each level. Radiographs were obtained for documentation purposes.   After attaining the desired flow of contrast documented above, a 0.5 to 1.0 ml test dose of 0.25% Marcaine was injected into each respective transforaminal space.  The patient was observed for 90 seconds post injection.  After no sensory deficits were reported, and normal lower extremity motor function was noted,   the above  injectate was administered so that equal amounts of the injectate were placed at each foramen (level) into the transforaminal epidural space.   Additional Comments:  The patient tolerated the procedure well Dressing: 2 x 2 sterile gauze and Band-Aid    Post-procedure details: Patient was observed during the procedure. Post-procedure  instructions were reviewed.  Patient left the clinic in stable condition.       Clinical History: MRI LUMBAR SPINE WITHOUT CONTRAST  TECHNIQUE: Multiplanar, multisequence MR imaging of the lumbar spine was performed. No intravenous contrast was administered.  COMPARISON:  Lumbar spine radiographs 03/19/2019  FINDINGS: Segmentation:  Standard.  Alignment:  Normal.  Vertebrae: No fracture, suspicious osseous lesion, or significant marrow edema. Mild chronic degenerative endplate changes at W2-N5.  Conus medullaris and cauda equina: Conus extends to the L1 level. Conus and cauda equina appear normal.  Paraspinal and other soft tissues: Unremarkable.  Disc levels:  Diffuse lumbar spinal canal narrowing due to congenitally short pedicles. Disc desiccation from L2-3 to L5-S1. Severe disc space narrowing at L5-S1 with mild narrowing at L4-5.  T12-L1: Mild facet and ligamentum flavum hypertrophy without disc herniation or stenosis.  L1-2: Mild facet and ligamentum flavum hypertrophy without disc herniation or stenosis.  L2-3: Mild disc bulging and mild facet and ligamentum flavum hypertrophy without significant stenosis.  L3-4: Circumferential disc bulging, a right paracentral and right subarticular disc protrusion, congenitally short pedicles, and mild facet and ligamentum flavum hypertrophy result in severe spinal stenosis, severe right greater than left lateral recess stenosis, and mild bilateral neural foraminal stenosis. Potential bilateral L4 nerve root impingement.  L4-5: Mild circumferential disc bulging, a small left paracentral disc protrusion, congenitally short pedicles, and mild facet and ligamentum flavum hypertrophy result in mild left greater than right lateral recess stenosis and mild left neural foraminal stenosis without significant spinal stenosis.  L5-S1: Mild circumferential disc bulging, endplate spurring, congenitally short  pedicles, and mild facet hypertrophy result in mild bilateral neural foraminal stenosis without spinal stenosis.  IMPRESSION: 1. 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 2. Mild lateral recess and neural foraminal stenosis at L4-5. 3. Mild neural foraminal stenosis at L5-S1.   Electronically Signed   By: Logan Bores M.D.   On: 08/28/2019 09:38     Objective:  VS:  HT:    WT:   BMI:     BP:(!) 148/83  HR:75bpm  TEMP: ( )  RESP:  Physical Exam Constitutional:      General: He is not in acute distress.    Appearance: Normal appearance. He is not ill-appearing.  HENT:     Head: Normocephalic and atraumatic.     Right Ear: External ear normal.     Left Ear: External ear normal.  Eyes:     Extraocular Movements: Extraocular movements intact.  Cardiovascular:     Rate and Rhythm: Normal rate.     Pulses: Normal pulses.  Abdominal:     General: There is no distension.     Palpations: Abdomen is soft.  Musculoskeletal:        General: No tenderness or signs of injury.     Right lower leg: No edema.     Left lower leg: No edema.     Comments: Patient has good distal strength without clonus.  Skin:    Findings: No erythema or rash.  Neurological:  General: No focal deficit present.     Mental Status: He is alert and oriented to person, place, and time.     Sensory: No sensory deficit.     Motor: No weakness or abnormal muscle tone.     Coordination: Coordination normal.  Psychiatric:        Mood and Affect: Mood normal.        Behavior: Behavior normal.      Imaging: No results found.

## 2020-03-17 NOTE — Procedures (Signed)
Lumbosacral Transforaminal Epidural Steroid Injection - Sub-Pedicular Approach with Fluoroscopic Guidance  Patient: Isaiah Park      Date of Birth: [birth year] MRN: [id number] PCP: Glenis Smoker, MD      Visit Date: 09/26/2019   Universal Protocol:    Date/Time: 09/26/2019  Consent Given By: the patient  Position: PRONE  Additional Comments: Vital signs were monitored before and after the procedure. Patient was prepped and draped in the usual sterile fashion. The correct patient, procedure, and site was verified.   Injection Procedure Details:  Procedure Site One Meds Administered:  Meds ordered this encounter  Medications  . methylPREDNISolone acetate (DEPO-MEDROL) injection 80 mg    Laterality: Bilateral  Location/Site:  L3-L4  Needle size: 22 G  Needle type: Spinal  Needle Placement: Transforaminal  Findings:    -Comments: Excellent flow of contrast along the nerve and into the epidural space.  Right-sided flow was somewhat stenotic compared to the left.  Procedure Details: After squaring off the end-plates to get a true AP view, the C-arm was positioned so that an oblique view of the foramen as noted above was visualized. The target area is just inferior to the "nose of the scotty dog" or sub pedicular. The soft tissues overlying this structure were infiltrated with 2-3 ml. of 1% Lidocaine without Epinephrine.  The spinal needle was inserted toward the target using a "trajectory" view along the fluoroscope beam.  Under AP and lateral visualization, the needle was advanced so it did not puncture dura and was located close the 6 O'Clock position of the pedical in AP tracterory. Biplanar projections were used to confirm position. Aspiration was confirmed to be negative for CSF and/or blood. A 1-2 ml. volume of Isovue-250 was injected and flow of contrast was noted at each level. Radiographs were obtained for documentation purposes.   After attaining the desired  flow of contrast documented above, a 0.5 to 1.0 ml test dose of 0.25% Marcaine was injected into each respective transforaminal space.  The patient was observed for 90 seconds post injection.  After no sensory deficits were reported, and normal lower extremity motor function was noted,   the above injectate was administered so that equal amounts of the injectate were placed at each foramen (level) into the transforaminal epidural space.   Additional Comments:  The patient tolerated the procedure well Dressing: 2 x 2 sterile gauze and Band-Aid    Post-procedure details: Patient was observed during the procedure. Post-procedure instructions were reviewed.  Patient left the clinic in stable condition.

## 2020-04-17 ENCOUNTER — Telehealth: Payer: Self-pay | Admitting: Physical Medicine and Rehabilitation

## 2020-04-17 NOTE — Telephone Encounter (Signed)
Patient called needing an appointment with Dr Alvester Morin for his back and legs. The number to contact patient is [phone]

## 2020-04-18 NOTE — Telephone Encounter (Signed)
Pt called wanting another injection pt had Bil L3-4 TF, if no new injuries/traumas and injection helped ok to repeat? Please Advise.    Called pt and lvm #1 to see if pt meets criteria to repeat injection.

## 2020-04-21 NOTE — Telephone Encounter (Signed)
Okay if it did help and nothing new

## 2020-04-21 NOTE — Telephone Encounter (Signed)
Scheduled for 8/9 at 1400 with driver.

## 2020-05-19 ENCOUNTER — Other Ambulatory Visit: Payer: Self-pay

## 2020-05-19 ENCOUNTER — Ambulatory Visit: Payer: Self-pay

## 2020-05-19 ENCOUNTER — Ambulatory Visit: Payer: BC Managed Care – PPO | Admitting: Physical Medicine and Rehabilitation

## 2020-05-19 ENCOUNTER — Encounter: Payer: Self-pay | Admitting: Physical Medicine and Rehabilitation

## 2020-05-19 VITALS — BP 127/80 | HR 70

## 2020-05-19 DIAGNOSIS — Z96642 Presence of left artificial hip joint: Secondary | ICD-10-CM | POA: Diagnosis not present

## 2020-05-19 DIAGNOSIS — M48062 Spinal stenosis, lumbar region with neurogenic claudication: Secondary | ICD-10-CM

## 2020-05-19 DIAGNOSIS — M5416 Radiculopathy, lumbar region: Secondary | ICD-10-CM

## 2020-05-19 MED ORDER — METHYLPREDNISOLONE ACETATE 80 MG/ML IJ SUSP
80.0000 mg | Freq: Once | INTRAMUSCULAR | Status: AC
  Administered 2020-05-19: 80 mg

## 2020-05-19 NOTE — Progress Notes (Signed)
Pt states lower back pain that travels to the posterior of both leg then travel to the front of the right leg. Pt states the pain wakes him up at night. Pt states pain meds. Pt has hx of inj on 09/25/21 it worked for two week.  Numeric Pain Rating Scale and Functional Assessment Average Pain 1   In the last MONTH (on 0-10 scale) has pain interfered with the following?  1. General activity like being  able to carry out your everyday physical activities such as walking, climbing stairs, carrying groceries, or moving a chair?  Rating(6)   +Driver, -BT, -Dye Allergies.

## 2020-05-20 ENCOUNTER — Encounter: Payer: Self-pay | Admitting: Physical Medicine and Rehabilitation

## 2020-05-20 NOTE — Procedures (Signed)
Lumbar Epidural Steroid Injection - Interlaminar Approach with Fluoroscopic Guidance  Patient: Isaiah Park      Date of Birth: 1961-05-13 MRN: [id number] PCP: Glenis Smoker, MD      Visit Date: 05/19/2020   Universal Protocol:     Consent Given By: the patient  Position: PRONE  Additional Comments: Vital signs were monitored before and after the procedure. Patient was prepped and draped in the usual sterile fashion. The correct patient, procedure, and site was verified.   Injection Procedure Details:  Procedure Site One Meds Administered:  Meds ordered this encounter  Medications   methylPREDNISolone acetate (DEPO-MEDROL) injection 80 mg     Laterality: Right  Location/Site:  L4-L5  Needle size: 20 G  Needle type: Tuohy  Needle Placement: Paramedian epidural  Findings:   -Comments: Excellent flow of contrast into the epidural space.  Procedure Details: Using a paramedian approach from the side mentioned above, the region overlying the inferior lamina was localized under fluoroscopic visualization and the soft tissues overlying this structure were infiltrated with 4 ml. of 1% Lidocaine without Epinephrine. The Tuohy needle was inserted into the epidural space using a paramedian approach.   The epidural space was localized using loss of resistance along with lateral and bi-planar fluoroscopic views.  After negative aspirate for air, blood, and CSF, a 2 ml. volume of Isovue-250 was injected into the epidural space and the flow of contrast was observed. Radiographs were obtained for documentation purposes.    The injectate was administered into the level noted above.   Additional Comments:  The patient tolerated the procedure well Dressing: 2 x 2 sterile gauze and Band-Aid    Post-procedure details: Patient was observed during the procedure. Post-procedure instructions were reviewed.  Patient left the clinic in stable condition.

## 2020-05-20 NOTE — Progress Notes (Signed)
Isaiah Park - 59 y.o. male MRN [id number]  Date of birth: [birth year]  Office Visit Note: Visit Date: 05/19/2020 PCP: Glenis Smoker, MD Referred by: Glenis Smoker, MD  Subjective: Chief Complaint  Patient presents with  . Lower Back - Pain  . Left Leg - Pain  . Right Leg - Pain   HPI: Isaiah Park is a 59 y.o. male who comes in today For evaluation and management of chronic worsening low back pain with referral of pain in the posterior part of both legs but in particular the right leg and somewhat anterior on the right leg.  His history can be reviewed through our former notes.  He was originally referred to [place] by Dr. Doneen Poisson.  He has had a left total hip replacement.  We have completed prior transforaminal injection at L3 with only a limited amount of pain relief.  This was in December of last year.  Reviewing the imaging again shows the right side probably was not epidural as he did have some foraminal narrowing.  Left side was clearly epidural.  The patient does not really like the idea of injections in general and is somewhat anxious.  His history is complicated by obesity as well as diabetes.  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  He has been doing well and really maintaining activity level as long as he takes ibuprofen.  This can be reviewed in our last note with him and April.  He takes 4 over-the-counter ibuprofen twice a day.  This is followed by his primary care physician.  He is a diabetic but has not really had any significant heart issues.  He reports blood pressure medications used to treat migraines.  He does use Flexeril on occasion.  He has had no focal weakness or new trauma or bowel or bladder changes.  No prior lumbar surgery.  MRI once again reviewed with the patient today and reviewed below.  This does show significant stenosis at L3-4 without listhesis.  Lateral recess narrowing at L4-5.  Review of Systems    Musculoskeletal: Positive for back pain and joint pain.  Neurological: Positive for tingling.  All other systems reviewed and are negative.  Otherwise per HPI.  Assessment & Plan: Visit Diagnoses:  1. Lumbar radiculopathy   2. Spinal stenosis of lumbar region with neurogenic claudication   3. Status post total replacement of left hip     Plan: Findings:  Chronic worsening severe at times intermittent low back pain with bilateral radicular type pain into the posterior buttock and thighs consistent with neurogenic claudication from severe stenosis at L3-4.  At this point we want to complete diagnostic and hopefully therapeutic L4-5 interlaminar epidural steroid injection.  Patient really did not like the feel of the needling down on the foraminal approach and that was done in December so I think if we can try to get an interlaminar approach that works.  He will do fairly well.  He also would like to be evaluated by neurosurgeon.  I did put a referral in today for Dr. Marikay Alar at [facility].  Patient probably would do well with 1 level or possibly 1 level plus lateral recess laminectomy decompression.  He will continue on current ibuprofen.  Meloxicam did not help as much.  He will continue with muscle relaxers as needed.  He will follow-up with his primary care physician concerning the ibuprofen.    Meds &  Orders:  Meds ordered this encounter  Medications  . methylPREDNISolone acetate (DEPO-MEDROL) injection 80 mg    Orders Placed This Encounter  Procedures  . XR C-ARM NO REPORT  . Ambulatory referral to Neurosurgery  . Epidural Steroid injection    Follow-up: Return if symptoms worsen or fail to improve.   Procedures: No procedures performed  Lumbar Epidural Steroid Injection - Interlaminar Approach with Fluoroscopic Guidance  Patient: Isaiah Park      Date of Birth: Jul 08, 1961 MRN: [id number] PCP: Glenis SmokerJaswani, Sanjay M, MD      Visit Date:  05/19/2020   Universal Protocol:     Consent Given By: the patient  Position: PRONE  Additional Comments: Vital signs were monitored before and after the procedure. Patient was prepped and draped in the usual sterile fashion. The correct patient, procedure, and site was verified.   Injection Procedure Details:  Procedure Site One Meds Administered:  Meds ordered this encounter  Medications  . methylPREDNISolone acetate (DEPO-MEDROL) injection 80 mg     Laterality: Right  Location/Site:  L4-L5  Needle size: 20 G  Needle type: Tuohy  Needle Placement: Paramedian epidural  Findings:   -Comments: Excellent flow of contrast into the epidural space.  Procedure Details: Using a paramedian approach from the side mentioned above, the region overlying the inferior lamina was localized under fluoroscopic visualization and the soft tissues overlying this structure were infiltrated with 4 ml. of 1% Lidocaine without Epinephrine. The Tuohy needle was inserted into the epidural space using a paramedian approach.   The epidural space was localized using loss of resistance along with lateral and bi-planar fluoroscopic views.  After negative aspirate for air, blood, and CSF, a 2 ml. volume of Isovue-250 was injected into the epidural space and the flow of contrast was observed. Radiographs were obtained for documentation purposes.    The injectate was administered into the level noted above.   Additional Comments:  The patient tolerated the procedure well Dressing: 2 x 2 sterile gauze and Band-Aid    Post-procedure details: Patient was observed during the procedure. Post-procedure instructions were reviewed.  Patient left the clinic in stable condition.    Clinical History: MRI LUMBAR SPINE WITHOUT CONTRAST  TECHNIQUE: Multiplanar, multisequence MR imaging of the lumbar spine was performed. No intravenous contrast was administered.  COMPARISON:  Lumbar spine radiographs  03/19/2019  FINDINGS: Segmentation:  Standard.  Alignment:  Normal.  Vertebrae: No fracture, suspicious osseous lesion, or significant marrow edema. Mild chronic degenerative endplate changes at L5-S1.  Conus medullaris and cauda equina: Conus extends to the L1 level. Conus and cauda equina appear normal.  Paraspinal and other soft tissues: Unremarkable.  Disc levels:  Diffuse lumbar spinal canal narrowing due to congenitally short pedicles. Disc desiccation from L2-3 to L5-S1. Severe disc space narrowing at L5-S1 with mild narrowing at L4-5.  T12-L1: Mild facet and ligamentum flavum hypertrophy without disc herniation or stenosis.  L1-2: Mild facet and ligamentum flavum hypertrophy without disc herniation or stenosis.  L2-3: Mild disc bulging and mild facet and ligamentum flavum hypertrophy without significant stenosis.  L3-4: Circumferential disc bulging, a right paracentral and right subarticular disc protrusion, congenitally short pedicles, and mild facet and ligamentum flavum hypertrophy result in severe spinal stenosis, severe right greater than left lateral recess stenosis, and mild bilateral neural foraminal stenosis. Potential bilateral L4 nerve root impingement.  L4-5: Mild circumferential disc bulging, a small left paracentral disc protrusion, congenitally short pedicles, and mild facet and ligamentum flavum hypertrophy result in  mild left greater than right lateral recess stenosis and mild left neural foraminal stenosis without significant spinal stenosis.  L5-S1: Mild circumferential disc bulging, endplate spurring, congenitally short pedicles, and mild facet hypertrophy result in mild bilateral neural foraminal stenosis without spinal stenosis.  IMPRESSION: 1. 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 2. Mild lateral recess and neural foraminal stenosis at L4-5. 3. Mild neural foraminal stenosis at L5-S1.   Electronically Signed   By: Sebastian Ache M.D.   On: 08/28/2019 09:38   He reports that he has never smoked. He has never used smokeless tobacco. No results for input(s): HGBA1C, LABURIC in the last 8760 hours.  Objective:  VS:  HT:    WT:   BMI:     BP:127/80  HR:70bpm  TEMP: ( )  RESP:  Physical Exam Vitals and nursing note reviewed.  Constitutional:      General: He is not in acute distress.    Appearance: Normal appearance. He is well-developed. He is obese.  HENT:     Head: Normocephalic and atraumatic.  Eyes:     Conjunctiva/sclera: Conjunctivae normal.     Pupils: Pupils are equal, round, and reactive to light.  Cardiovascular:     Rate and Rhythm: Normal rate.     Pulses: Normal pulses.     Heart sounds: Normal heart sounds.  Pulmonary:     Effort: Pulmonary effort is normal. No respiratory distress.  Musculoskeletal:     Cervical back: Normal range of motion and neck supple. No rigidity.     Right lower leg: No edema.     Left lower leg: No edema.     Comments: Ambulates with somewhat of a forward flexed lumbar spine he does have some back pain with extension and facet loading.  No effective trigger points.  No pain over the greater trochanters no pain with hip rotation.  He has good distal strength.  Skin:    General: Skin is warm and dry.     Findings: No erythema or rash.  Neurological:     General: No focal deficit present.     Mental Status: He is alert and oriented to person, place, and time.     Sensory: No sensory deficit.     Motor: No weakness.     Coordination: Coordination normal.     Gait: Gait normal.  Psychiatric:        Mood and Affect: Mood normal.        Behavior: Behavior normal.     Ortho Exam  Imaging: XR C-ARM NO REPORT  Result Date: 05/19/2020 Please see Notes tab for imaging  impression.   Past Medical/Family/Surgical/Social History: Medications & Allergies reviewed per EMR, new medications updated. Patient Active Problem List   Diagnosis Date Noted  . Arthritis of left hip 04/26/2014  . Status post THR (total hip replacement) 04/26/2014   Past Medical History:  Diagnosis Date  . Arthritis   . Diabetes mellitus without complication (HCC)   . Headache(784.0)    migraines  . Hypertension    History reviewed. No pertinent family history. Past Surgical History:  Procedure Laterality Date  . HERNIA REPAIR     left inguinal   . TOTAL HIP ARTHROPLASTY Left 04/26/2014   Procedure: LEFT TOTAL HIP ARTHROPLASTY ANTERIOR APPROACH;  Surgeon: Kathryne Hitch, MD;  Location: [facility];  Service: Orthopedics;  Laterality: Left;   Social History   Occupational History  . Not on file  Tobacco Use  . Smoking status: Never Smoker  . Smokeless tobacco: Never Used  Substance and Sexual Activity  . Alcohol use: Yes    Comment: occassionally beer  . Drug use: No  . Sexual activity: Not on file

## 2020-09-11 ENCOUNTER — Other Ambulatory Visit: Payer: Self-pay | Admitting: Student

## 2020-09-11 DIAGNOSIS — M5416 Radiculopathy, lumbar region: Secondary | ICD-10-CM

## 2020-09-28 ENCOUNTER — Other Ambulatory Visit: Payer: Self-pay

## 2020-09-28 ENCOUNTER — Ambulatory Visit
Admission: RE | Admit: 2020-09-28 | Discharge: 2020-09-28 | Disposition: A | Discharge status: Home / Self Care | Payer: BC Managed Care – PPO | Source: Ambulatory Visit | Attending: Student | Admitting: Student

## 2020-09-28 DIAGNOSIS — M5416 Radiculopathy, lumbar region: Secondary | ICD-10-CM

## 2022-04-29 ENCOUNTER — Ambulatory Visit (INDEPENDENT_AMBULATORY_CARE_PROVIDER_SITE_OTHER): Payer: BC Managed Care – PPO

## 2022-04-29 ENCOUNTER — Ambulatory Visit: Payer: BC Managed Care – PPO | Admitting: Physician Assistant

## 2022-04-29 ENCOUNTER — Encounter: Payer: Self-pay | Admitting: Physician Assistant

## 2022-04-29 DIAGNOSIS — M5416 Radiculopathy, lumbar region: Secondary | ICD-10-CM | POA: Diagnosis not present

## 2022-04-29 DIAGNOSIS — Z96642 Presence of left artificial hip joint: Secondary | ICD-10-CM

## 2022-04-29 DIAGNOSIS — M7062 Trochanteric bursitis, left hip: Secondary | ICD-10-CM | POA: Diagnosis not present

## 2022-04-29 MED ORDER — LIDOCAINE HCL 1 % IJ SOLN
3.0000 mL | INTRAMUSCULAR | Status: AC | PRN
  Administered 2022-04-29: 3 mL

## 2022-04-29 MED ORDER — METHYLPREDNISOLONE ACETATE 40 MG/ML IJ SUSP
40.0000 mg | INTRAMUSCULAR | Status: AC | PRN
  Administered 2022-04-29: 40 mg via INTRA_ARTICULAR

## 2022-04-29 NOTE — Progress Notes (Signed)
Office Visit Note   Patient: Isaiah Park           Date of Birth: [birth year]           MRN: [id number] Visit Date: 04/29/2022              Requested by: Glenis Smoker, MD [address] [place],  [place] [address] PCP: Glenis Smoker, MD   Assessment & Plan: Visit Diagnoses:  1. Lumbar radiculopathy   2. Status post total replacement of left hip     Plan: We will have him follow-up with [place] in 2 weeks to see how he is doing in regards to the left hip trochanteric bursitis.  He is shown IT band stretching exercises.  In regards to the right leg radicular pain given his history of back surgery by Dr. Yetta Barre in 2021 recommend he follow-up with Dr. Yetta Barre for this.  In the past therapy and epidural steroid injections were not beneficial.  Follow-Up Instructions: Return in about 2 weeks (around 05/13/2022).   Orders:  Orders Placed This Encounter  Procedures   Large Joint Inj   XR Lumbar Spine 2-3 Views   XR HIP UNILAT W OR W/O PELVIS 2-3 VIEWS LEFT   Ambulatory referral to Neurosurgery   No orders of the defined types were placed in this encounter.     Procedures: Large Joint Inj: L greater trochanter on 04/29/2022 5:02 PM Indications: pain Details: 22 G 1.5 in needle, lateral approach  Arthrogram: No  Medications: 3 mL lidocaine 1 %; 40 mg methylPREDNISolone acetate 40 MG/ML Outcome: tolerated well, no immediate complications Procedure, treatment alternatives, risks and benefits explained, specific risks discussed. Consent was given by the patient. Immediately prior to procedure a time out was called to verify the correct patient, procedure, equipment, support staff and site/side marked as required. Patient was prepped and draped in the usual sterile fashion.       Clinical Data: No additional findings.   Subjective: Chief Complaint  Patient presents with   Left Hip - Pain   Lower Back - Pain    HPI [name] 61 year old male comes in today with 13-month  history of left hip "locking up.  He has a history of left total hip arthroplasty performed by Dr. Magnus Park.  He also is having right lateral thigh burning down into his calf area if he sits too long.  He ranks this pain to be 8-9 out of 10 pain.  States he is unable to ride his motorcycle and long distances due to the pain.  Has history of back surgery by Dr. Yetta Barre in 2021.  He has had no new injury to his back.  However he does work a job and when she does a lot of heavy lifting and walking.  He denies any groin pain bilaterally.  He has tried no real treatment for this.  He is diabetic hemoglobin A1c 7.4.  Denies any bowel bladder dysfunction, waking pain, saddle anesthesia like symptoms or unintentional weight loss.  Review of Systems See HPI otherwise negative  Objective: Vital Signs: There were no vitals taken for this visit.  Physical Exam General: Well-developed well-nourished male in no acute distress.  He is able to get off the exam table on his own. Ortho Exam Lower extremities he has 5 out of 5 strength throughout lower extremities bilaterally.  Tight hamstrings bilaterally.  Negative straight leg raise bilaterally.  Tenderness over the left hip trochanteric region.  Good range of motion  bilateral hips without pain. Specialty Comments:  No specialty comments available.  Imaging: XR Lumbar Spine 2-3 Views  Result Date: 04/29/2022 Lumbar spine 2 views: No acute fractures or acute findings.  Diminished disc space at L5-S1.  No spondylolisthesis.  XR HIP UNILAT W OR W/O PELVIS 2-3 VIEWS LEFT  Result Date: 04/29/2022 AP pelvis lateral view of the left hip: No acute fractures.  Hips well located.  No evidence of loosening or hardware failure involving left hip.  No other bony abnormalities noted.    PMFS History: Patient Active Problem List   Diagnosis Date Noted   Arthritis of left hip 04/26/2014   Status post THR (total hip replacement) 04/26/2014   Past Medical History:   Diagnosis Date   Arthritis    Diabetes mellitus without complication (HCC)    Headache(784.0)    migraines   Hypertension     History reviewed. No pertinent family history.  Past Surgical History:  Procedure Laterality Date   HERNIA REPAIR     left inguinal    TOTAL HIP ARTHROPLASTY Left 04/26/2014   Procedure: LEFT TOTAL HIP ARTHROPLASTY ANTERIOR APPROACH;  Surgeon: Kathryne Hitch, MD;  Location: [facility];  Service: Orthopedics;  Laterality: Left;   Social History   Occupational History   Not on file  Tobacco Use   Smoking status: Never   Smokeless tobacco: Never  Substance and Sexual Activity   Alcohol use: Yes    Comment: occassionally beer   Drug use: No   Sexual activity: Not on file

## 2022-05-13 ENCOUNTER — Ambulatory Visit: Payer: BC Managed Care – PPO | Admitting: Physician Assistant

## 2022-10-21 IMAGING — MR MR LUMBAR SPINE W/O CM
4 of 5 series · 24 of 48 positions shown · non-contrast
Comparison: Prior MRI from 08/27/2019.

CLINICAL DATA: Initial evaluation for back pain with bilateral hip
and leg pain for 1 year.

EXAM:
MRI LUMBAR SPINE WITHOUT CONTRAST
TECHNIQUE: Multiplanar, multisequence MR imaging of the lumbar spine was
performed. No intravenous contrast was administered.

[Series 3: T2 post-contrast · sagittal · 4.0mm · 0.59mm/px · 6 of 16 slices shown]
[im 1/16]
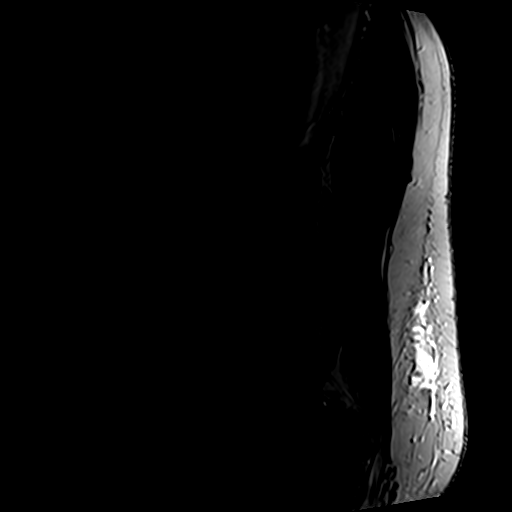
[im 4/16]
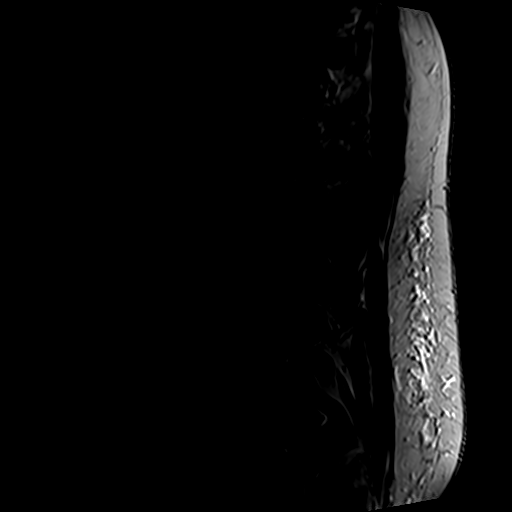
[im 7/16]
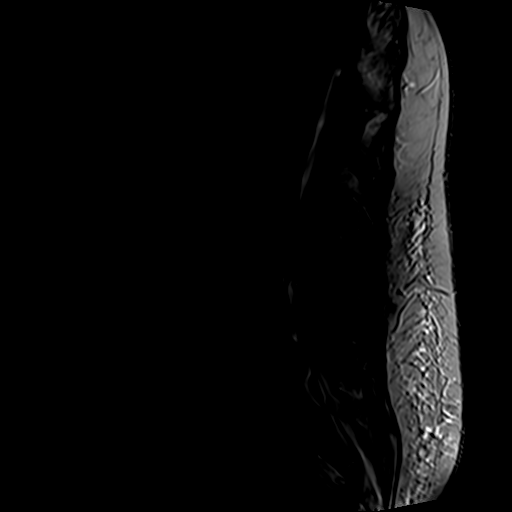
[im 10/16]
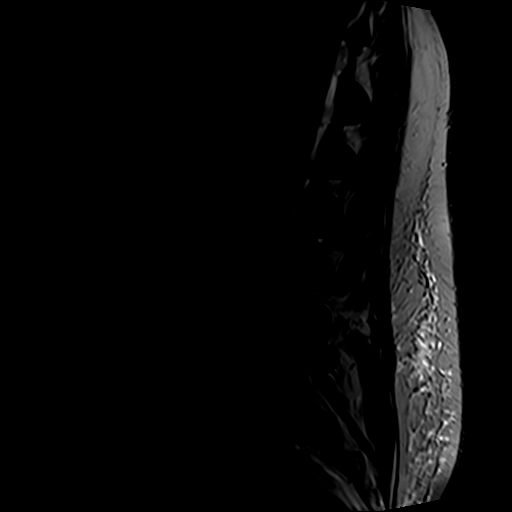
[im 13/16]
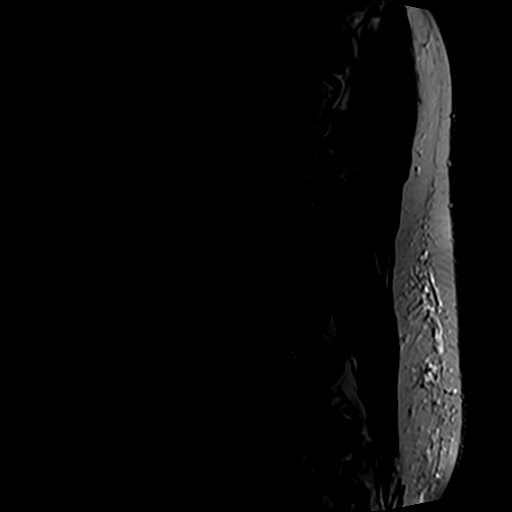
[im 16/16]
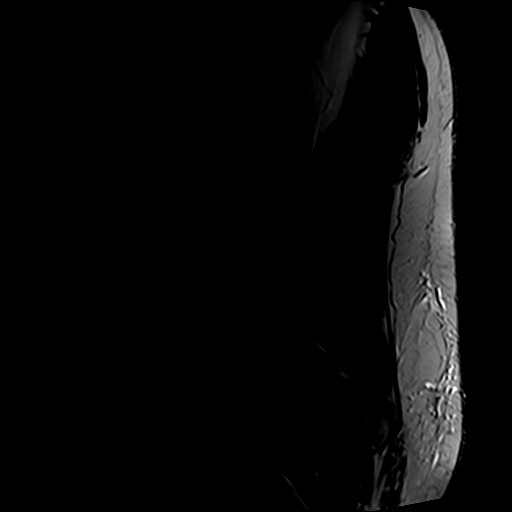

[Series 5: T1 · sagittal · 4.0mm · 0.59mm/px · 6 of 16 slices shown (1 of 2)]
[im 1/16]
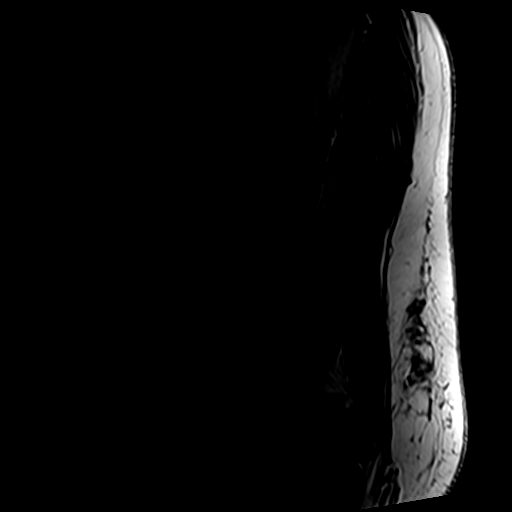
[im 4/16]
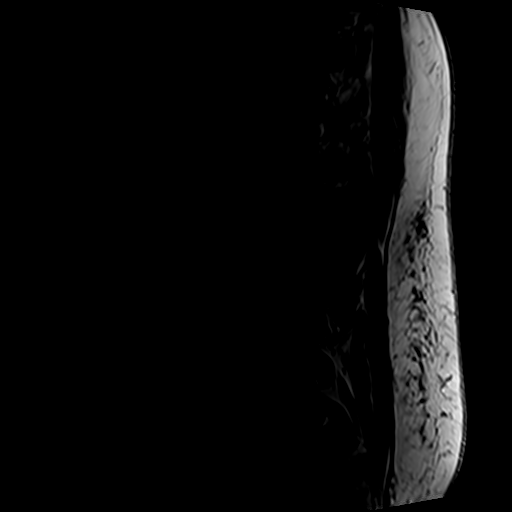
[im 7/16]
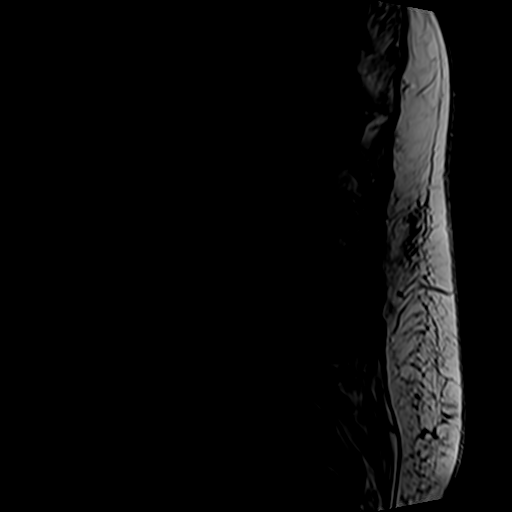
[im 10/16]
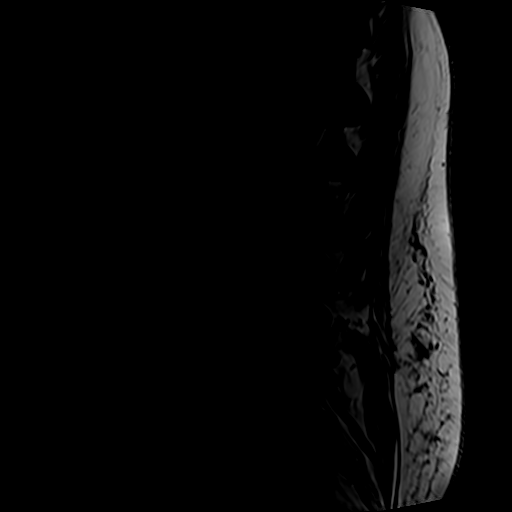
[im 13/16]
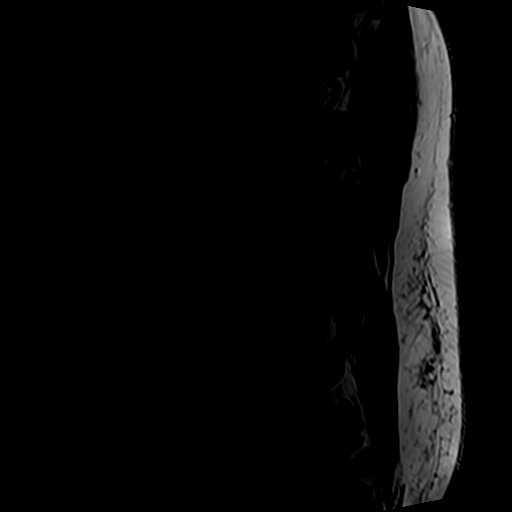
[im 16/16]
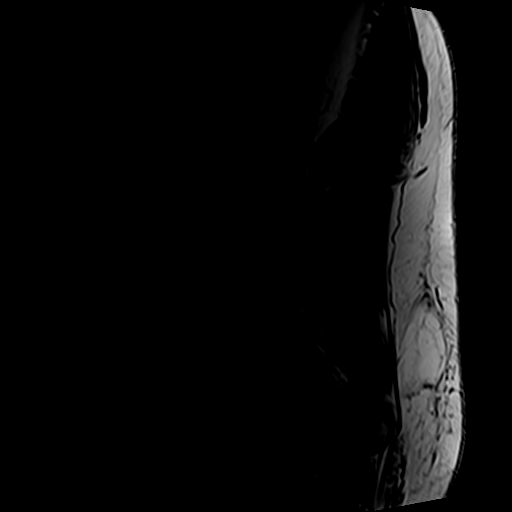

[Series 6: T2 · axial · 4.0mm · 0.70mm/px · z∈[-22,+192]mm · 9 of 39 slices shown]
[im 1/39]
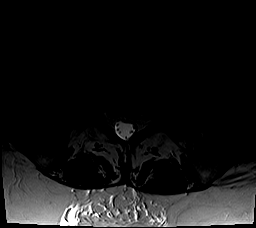
[im 6/39]
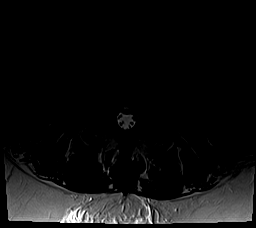
[im 11/39]
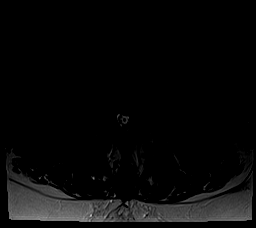
[im 17/39]
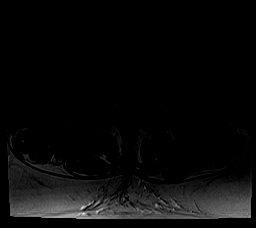
[im 20/39]
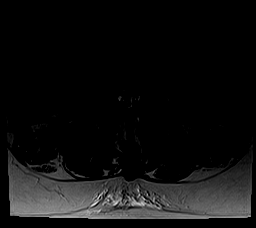
[im 22/39]
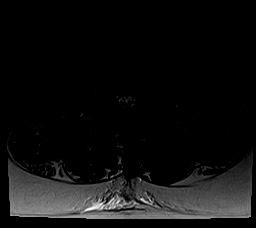
[im 28/39]
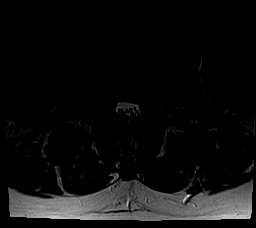
[im 33/39]
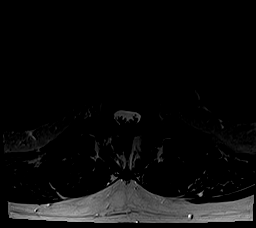
[im 39/39]
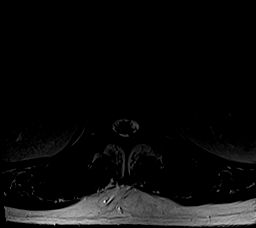

[Series 7: T1 · axial · 4.0mm · 0.35mm/px · z∈[+3,+160]mm · 3 of 39 slices shown (2 of 2)]
[im 6/39]
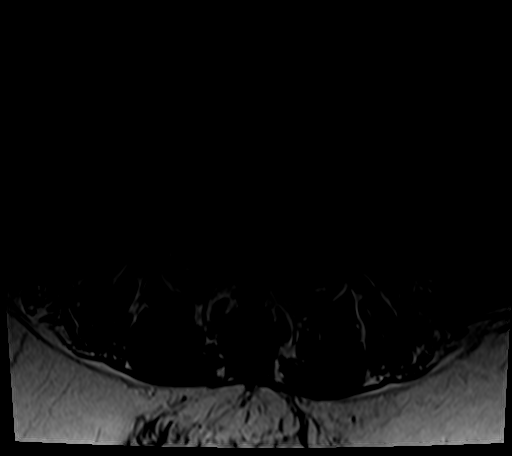
[im 20/39]
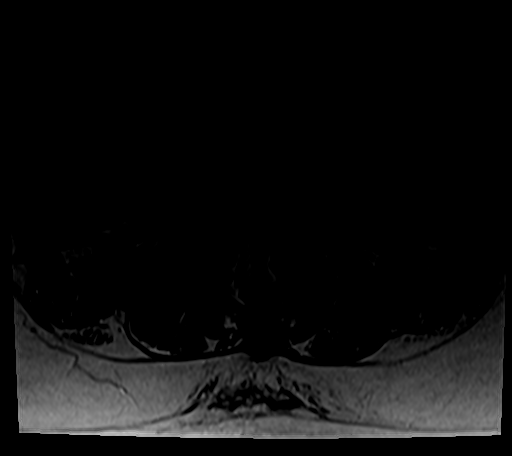
[im 33/39]
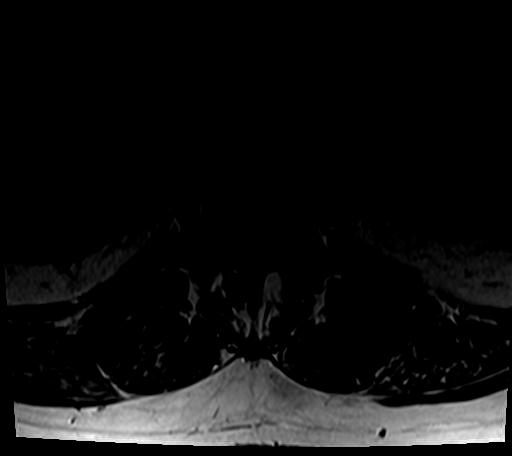

[24 of 48 positions shown; findings below may reference images not displayed]

FINDINGS: Segmentation: Standard. Lowest well-formed disc space labeled the
L5-S1 level.

Alignment: Physiologic with preservation of the normal lumbar
lordosis. No listhesis.

Vertebrae: Vertebral body height maintained without acute or chronic
fracture. Bone marrow signal intensity within normal limits. No
discrete or worrisome osseous lesions. Mild reactive marrow edema
noted about the right L3-4 and L4-5 facets due to facet arthritis.
No other abnormal marrow edema.

Conus medullaris and cauda equina: Conus extends to the L1 level.
Conus and cauda equina appear normal.

Paraspinal and other soft tissues: Paraspinous soft tissues within
normal limits. Few scattered benign appearing cyst noted within the
visualized kidneys bilaterally. Visualized visceral structures
otherwise unremarkable.

Disc levels:

Diffuse congenital shortening of the pedicles with underlying
congenital spinal stenosis noted.

T11-12: Seen only on sagittal projection. Negative interspace.
Posterior element hypertrophy. Changes superimposed on short
pedicles resultant mild spinal stenosis. Foramina appear grossly
patent.

T12-L1: Negative interspace. Bilateral facet hypertrophy. No canal
or foraminal stenosis.

L1-2: Negative interspace. Mild facet and ligament flavum
hypertrophy. No significant spinal stenosis. Foramina remain patent.

L2-3: Disc desiccation with mild diffuse disc bulge. Moderate
bilateral facet hypertrophy. 5 mm synovial cyst at the anteromedial
aspect of the right L2-3 facet noted, stable from previous (series
6, image 17). Underlying short pedicles. Resultant mild narrowing of
the left lateral recess. Central canal remains patent. No
significant foraminal stenosis.

L3-4: Diffuse disc bulge with disc desiccation and intervertebral
disc space narrowing. Superimposed moderate-sized right subarticular
disc protrusion extends into the right lateral recess (series 6,
image 23). Associated slight superior and inferior migration of disc
material. This is increased in size from previous. Superimposed mild
to moderate facet hypertrophy with associated trace joint effusions.
Changes superimposed on short pedicles resultant severe canal with
severe right worse than left lateral recess stenosis. Thecal sac
measures 4 mm in AP diameter at its most narrow point. Mild
bilateral L3 foraminal stenosis.

L4-5: Disc desiccation with degenerative intervertebral disc space
narrowing and mild diffuse disc bulge. Associated reactive endplate
spurring. Superimposed shallow left subarticular disc protrusion
mildly indents the left ventral thecal sac. Moderate bilateral facet
hypertrophy. Right greater than left joint effusions. Changes
superimposed on short pedicles resultant mild canal with mild left
greater than right lateral recess stenosis. Mild right with moderate
left L4 foraminal narrowing.

L5-S1: Degenerative intervertebral disc space narrowing with diffuse
disc bulge and disc desiccation. Associated reactive endplate
spurring. Mild to moderate right greater than left facet
hypertrophy. No significant spinal stenosis. Mild bilateral L5
foraminal narrowing, stable.
IMPRESSION: 1. Moderate sized right subarticular disc protrusion at L3-4 with
resultant severe canal and right worse than left lateral recess
stenosis. Size of this protrusion is increased from previous, with
worsened spinal stenosis.
2. Degenerative disc bulge with facet hypertrophy at L4-5 with
resultant mild canal with left greater than right lateral recess
stenosis, with moderate left L4 foraminal narrowing.
3. Moderate bilateral facet hypertrophy at L2-3 through L5-S1, which
could contribute to lower back pain.
4. Diffuse shortening of the pedicles with underlying congenital
spinal stenosis.

## 2023-10-31 ENCOUNTER — Ambulatory Visit: Payer: BC Managed Care – PPO | Admitting: Physician Assistant

## 2023-10-31 ENCOUNTER — Other Ambulatory Visit (INDEPENDENT_AMBULATORY_CARE_PROVIDER_SITE_OTHER): Payer: BC Managed Care – PPO

## 2023-10-31 ENCOUNTER — Encounter: Payer: Self-pay | Admitting: Physician Assistant

## 2023-10-31 DIAGNOSIS — M25511 Pain in right shoulder: Secondary | ICD-10-CM

## 2023-10-31 DIAGNOSIS — G8929 Other chronic pain: Secondary | ICD-10-CM | POA: Diagnosis not present

## 2023-10-31 MED ORDER — METHYLPREDNISOLONE ACETATE 40 MG/ML IJ SUSP
40.0000 mg | INTRAMUSCULAR | Status: AC | PRN
  Administered 2023-10-31: 40 mg via INTRA_ARTICULAR

## 2023-10-31 MED ORDER — LIDOCAINE HCL 1 % IJ SOLN
3.0000 mL | INTRAMUSCULAR | Status: AC | PRN
  Administered 2023-10-31: 3 mL

## 2023-10-31 NOTE — Progress Notes (Signed)
HPI: [name] comes in today due to right shoulder pain has been ongoing for the past 3 and half months.  He states he was pulling a refrigerator about at his home when he felt a pop and tearing sensation in his shoulder.  He denies any bruising down the arm.  He continues to have pain in that shoulder pain particularly with overhead motion.  Pain does awaken him.  He describes a painful popping within the shoulder.  No numbness tingling down the arm.  He has tried [employer] but otherwise no medications no treatments.  He is diabetic reports his hemoglobin A1c today was 7.2 at his doctor's appointment.  Review of systems: Denies any fevers chills.  Physical exam: General Well-developed well-nourished male no acute distress mood affect appropriate. Psych: Alert and oriented x 3 Bilateral shoulders he has full range of motion of the left shoulder without pain.  Right shoulder he has approximately 160 degrees of forward flexion Amreen full overhead position with some discomfort.  Strength testing reveals 4 out of 5 strength with external rotation right shoulder against resistance.  Empty can test is positive on the right negative on the left.  Slight weakness with liftoff on the right negative on the left.  Impingement testing positive on the right negative on the left.  Biceps appear equal.  He has no tenderness over the biceps muscle belly.  There is no bruising or erythema in this region.  Radiographs: 3 views right shoulder: Shoulder is well located.  Glenohumeral joint is well-maintained.  Mild AC joint arthritic changes.  No acute fractures acute findings.  Impression: Acute right shoulder pain  Plan: Recommended right shoulder subacromial injection patient was agreeable.  He is also shown forward flexion exercises and wall crawls.  Will see him back in 2 weeks see how he is doing overall.  Tolerated injection well today.   Procedure Note  Patient: Isaiah Park             Date of Birth:  01-17-1961           MRN: [id number]             Visit Date: 10/31/2023  Procedures: Visit Diagnoses:  1. Chronic right shoulder pain     Large Joint Inj: R subacromial bursa on 10/31/2023 6:34 PM Indications: pain Details: 22 G 1.5 in needle, superolateral approach  Arthrogram: No  Medications: 3 mL lidocaine 1 %; 40 mg methylPREDNISolone acetate 40 MG/ML Outcome: tolerated well, no immediate complications Procedure, treatment alternatives, risks and benefits explained, specific risks discussed. Consent was given by the patient. Immediately prior to procedure a time out was called to verify the correct patient, procedure, equipment, support staff and site/side marked as required. Patient was prepped and draped in the usual sterile fashion.

## 2023-11-17 ENCOUNTER — Ambulatory Visit: Payer: BC Managed Care – PPO | Admitting: Physician Assistant

## 2023-11-17 ENCOUNTER — Encounter: Payer: Self-pay | Admitting: Physician Assistant

## 2023-11-17 DIAGNOSIS — M25511 Pain in right shoulder: Secondary | ICD-10-CM | POA: Diagnosis not present

## 2023-11-17 NOTE — Progress Notes (Signed)
 HPI: [name] returns today status post right shoulder subacromial injection 10/31/2023.  He states he continues to have pain in the shoulder despite the injection.  States he only got some relief for the first 24 hours.  He is still having pain when reaching forward and overhead.  He notes a popping sensation of the shoulder.  No sense of dislocation.  He denies any numbness tingling down the arm.  Pain does awaken him at night if he lays on the right side.  Denies any neck pain.  Review of systems: Negative or noncontributory.  Physical exam: General Well-developed well-nourished male no acute distress.  Bilateral shoulders: 5 out of 5 strength with external and internal rotation against resistance.  Empty can test negative bilaterally.  Impingement testing is positive on the right.  Liftoff test positive on the right negative on the left.  Cervical spine good range of motion without pain.  Impression: Right shoulder impingement  Plan: Given his failure of conservative treatment which is included time injections and exercises recommend recommend MRI with arthrogram to rule out internal derangement.  Having follow-up after the MRI to go over results discuss further treatment.  Questions were encouraged and answered at length.

## 2023-12-07 ENCOUNTER — Ambulatory Visit
Admission: RE | Admit: 2023-12-07 | Discharge: 2023-12-07 | Disposition: A | Discharge status: Home / Self Care | Payer: BC Managed Care – PPO | Source: Ambulatory Visit | Attending: Physician Assistant | Admitting: Physician Assistant

## 2023-12-07 DIAGNOSIS — M25511 Pain in right shoulder: Secondary | ICD-10-CM

## 2023-12-07 MED ORDER — IOPAMIDOL (ISOVUE-M 200) INJECTION 41%
10.0000 mL | Freq: Once | INTRAMUSCULAR | Status: AC
  Administered 2023-12-07: 10 mL via INTRA_ARTICULAR

## 2023-12-29 ENCOUNTER — Ambulatory Visit: Payer: BC Managed Care – PPO | Admitting: Physician Assistant

## 2023-12-29 DIAGNOSIS — S46011A Strain of muscle(s) and tendon(s) of the rotator cuff of right shoulder, initial encounter: Secondary | ICD-10-CM | POA: Diagnosis not present

## 2023-12-29 DIAGNOSIS — S46011D Strain of muscle(s) and tendon(s) of the rotator cuff of right shoulder, subsequent encounter: Secondary | ICD-10-CM

## 2023-12-29 NOTE — Progress Notes (Signed)
 HPI: Isaiah Park returns today for continued right shoulder pain despite conservative treatment.  Again shoulder pain began last fall after he was pulling a refrigerator and felt something pop in his shoulder.  He feels that his range of motion and strength are decreasing.  He is diabetic hemoglobin A1c last was 7.2.  MRI right shoulder was reviewed with patient.  MRI shows a small rotator cuff tear which is full-thickness of the supraspinatus near the insertion.  Glenohumeral joint is overall well-preserved.  Moderate AC joint arthritis.  Moderate tendinosis of the biceps long head.  Labrum intact.  Physical exam: General Well-developed well-nourished male no acute distress mood and affect appropriate. Bilateral shoulders 5 out of 5 strength with internal rotation against resistance.  4 out of 5 strength on the right with external rotation against resistance.  Able to perform full overhead activity with some discomfort right shoulder.  Impression: Traumatic right shoulder rotator cuff tear  Plan: Given patient's failure treatment and his continued weakness and pain despite conservative measures recommend right shoulder arthroscopy with extensive debridement and rotator cuff repair.  Questions were encouraged and answered length.  Risk benefits discussed.  Postoperative protocol discussed with patient.  Will see him back in 1 week status post surgery.

## 2024-01-26 ENCOUNTER — Other Ambulatory Visit: Payer: Self-pay | Admitting: Orthopaedic Surgery

## 2024-01-26 DIAGNOSIS — S46011A Strain of muscle(s) and tendon(s) of the rotator cuff of right shoulder, initial encounter: Secondary | ICD-10-CM | POA: Diagnosis not present

## 2024-01-26 MED ORDER — OXYCODONE-ACETAMINOPHEN 5-325 MG PO TABS: 1.0000 | ORAL_TABLET | Freq: Four times a day (QID) | ORAL | 0 refills | Status: AC | PRN

## 2024-02-02 ENCOUNTER — Ambulatory Visit (INDEPENDENT_AMBULATORY_CARE_PROVIDER_SITE_OTHER): Admitting: Physician Assistant

## 2024-02-02 DIAGNOSIS — Z9889 Other specified postprocedural states: Secondary | ICD-10-CM

## 2024-02-02 MED ORDER — GABAPENTIN 300 MG PO CAPS
300.0000 mg | ORAL_CAPSULE | Freq: Every day | ORAL | 0 refills | Status: AC
Start: 2024-02-02 — End: ?

## 2024-02-02 NOTE — Progress Notes (Signed)
 HPI: [name] returns today 1 week status post right shoulder arthroscopy with rotator cuff repair.  He overall states he is having no pain in the right shoulder.  His main complaint is burning like sensation from the elbow down to the right thumb.  He has been difficulty sleeping.  He comes in without his sling he states he has only been wearing a sling when sleeping for the last 2 days.   Review of systems: See HPI otherwise negative  Physical exam: General Well-developed well-nourished male no acute distress. Skin: Right shoulder port sites are all well-approximated with interrupted nylon sutures.  No signs of infection.  No drainage. Right hand sensation grossly intact.  No ecchymosis erythema.  He is nontender over the first extensor compartment extensor.  Negative grind test.  Good range of motion right elbow full supination pronation forearm without pain.  Impression: Status post right shoulder arthroscopy with right rotator cuff repair.  Plan: He can come out of the sling just for range of motion elbow wrist and hand.  No abduction or overhead range of motion.  Sutures were harvested.  He will go back in his sling he can come out of it if he is just sitting around the house but if he is up ambulating or sleeping he needs to be in the sling.  Questions were encouraged and answered at length.  We did place him on some Neurontin  to take at night given the burning pain in his forearm down into the right thumb region.  Arthroscopy images reviewed with the patient.

## 2024-03-01 ENCOUNTER — Ambulatory Visit (INDEPENDENT_AMBULATORY_CARE_PROVIDER_SITE_OTHER): Admitting: Physician Assistant

## 2024-03-01 DIAGNOSIS — Z9889 Other specified postprocedural states: Secondary | ICD-10-CM

## 2024-03-01 NOTE — Progress Notes (Signed)
 HPI: [name] returns today follow-up status post right shoulder arthroscopy with rotator cuff repair.  He states that shoulder is doing okay he still having some trouble sleeping he is wearing his sling whenever he is up and about.  He still has some numbness he points to the radial aspect of the posterior right forearm.  This is in the distal one fourth of the forearm and to the dorsal hand but not into the thumb or index finger.  He states that gabapentin  really gave him no relief.  He has been taking Tylenol  and ibuprofen for the pain.  Denies any neck pain.  Review of systems see HPI otherwise negative  Physical exam: General Well-developed well-nourished male no acute distress. Right shoulder port sites are well-healed.  Fluid internal to neutral rotation of the right shoulder.  No attempts of overhead activity performed today. Right upper extremity 5 out of 5 strength biceps triceps against resistance.  Radial pulses 2+.  Sensation grossly intact throughout the right hand to light touch.  Negative Tinel's over the median nerve negative Finkelstein's.  Full range of motion of the right elbow without pain.  Nontender about the medial border of the scapula on the right and the trapezius region on the right.  Impression: Status post right shoulder arthroscopy with rotator cuff repair Right distal forearm paresthesia  Plan: We tried gabapentin  would not try Medrol  given his diabetes status.  Therefore we will watch the paresthesia if this continues may consider EMG nerve conduction studies to better evaluate this.  He is given a prescription for physical therapy as he has a therapist he wants to work with this is for range of motion strengthening modalities and home exercise program for the right shoulder.  Questions were encouraged and answered.  Follow-up with Dr. Lucienne Ryder in 4 weeks see how he is doing overall.  Questions were encouraged and answered

## 2024-04-02 ENCOUNTER — Ambulatory Visit (INDEPENDENT_AMBULATORY_CARE_PROVIDER_SITE_OTHER): Admitting: Orthopaedic Surgery

## 2024-04-02 ENCOUNTER — Encounter: Payer: Self-pay | Admitting: Orthopaedic Surgery

## 2024-04-02 DIAGNOSIS — Z9889 Other specified postprocedural states: Secondary | ICD-10-CM

## 2024-04-02 NOTE — Progress Notes (Signed)
 The patient is a 63 year old who is now 2 months status post a right shoulder arthroscopic rotator cuff repair.  He is 63 years old and active.  He says therapy is going very well for him.  His only issues sleeping at night.  The forearm paresthesias that he was having earlier on the right side of resolved he states.  On exam he still using his deltoid some to abduct his shoulder but overall he has got improve motion and improve strength for just being 2 months into surgery.  He understands this can take 5 to 6 months to get more improvement overall.  He will continue his exercise program and be careful with his right shoulder.  The next time we would like to see him will be in 3 months for repeat exam of the right shoulder.  All questions and concerns were addressed and answered.

## 2024-06-27 ENCOUNTER — Encounter: Payer: Self-pay | Admitting: Orthopaedic Surgery

## 2024-06-27 ENCOUNTER — Ambulatory Visit: Admitting: Orthopaedic Surgery

## 2024-06-27 DIAGNOSIS — Z9889 Other specified postprocedural states: Secondary | ICD-10-CM

## 2024-06-27 DIAGNOSIS — S46011D Strain of muscle(s) and tendon(s) of the rotator cuff of right shoulder, subsequent encounter: Secondary | ICD-10-CM

## 2024-06-27 NOTE — Progress Notes (Signed)
 The patient is a 63 year old gentleman who is now 5 months status post a right shoulder rotator cuff repair that was done arthroscopically.  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  He says that he has been through physical therapy and feels like he can do this now on his own.  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  On exam his range of motion of his right shoulder is pretty good.  He also has much improved strength of the rotator cuff with abduction and external rotation.  He can transition to just a home exercise program now and he still knows to go slow because there is a high rerupture rate as people get into the later ages.  He is pleased overall.  He feels like he is doing well.  All questions and concerns were answered and addressed.  Follow-up at this point can be as needed.
